# Patient Record
Sex: Male | Born: 1975 | Race: Black or African American | Hispanic: No | Marital: Single | Smoking: Never smoker
Health system: Southern US, Community
[De-identification: ages and names within clinical notes are randomized; demographics above are authoritative.]

## PROBLEM LIST (undated history)

## (undated) DIAGNOSIS — I10 Essential (primary) hypertension: Secondary | ICD-10-CM

## (undated) DIAGNOSIS — K429 Umbilical hernia without obstruction or gangrene: Secondary | ICD-10-CM

## (undated) HISTORY — PX: MOUTH SURGERY: SHX715

---

## 2010-02-09 ENCOUNTER — Encounter: Admission: RE | Admit: 2010-02-09 | Discharge: 2010-02-09 | Payer: Self-pay | Admitting: Family Medicine

## 2017-01-04 DIAGNOSIS — Z23 Encounter for immunization: Secondary | ICD-10-CM | POA: Diagnosis not present

## 2017-01-10 DIAGNOSIS — Z0000 Encounter for general adult medical examination without abnormal findings: Secondary | ICD-10-CM | POA: Diagnosis not present

## 2017-02-19 DIAGNOSIS — R310 Gross hematuria: Secondary | ICD-10-CM | POA: Diagnosis not present

## 2018-05-09 DIAGNOSIS — I499 Cardiac arrhythmia, unspecified: Secondary | ICD-10-CM | POA: Diagnosis not present

## 2018-05-09 DIAGNOSIS — I10 Essential (primary) hypertension: Secondary | ICD-10-CM | POA: Diagnosis not present

## 2019-07-29 DIAGNOSIS — R1033 Periumbilical pain: Secondary | ICD-10-CM | POA: Diagnosis not present

## 2019-07-29 DIAGNOSIS — I10 Essential (primary) hypertension: Secondary | ICD-10-CM | POA: Diagnosis not present

## 2019-07-29 DIAGNOSIS — Z0000 Encounter for general adult medical examination without abnormal findings: Secondary | ICD-10-CM | POA: Diagnosis not present

## 2019-07-29 DIAGNOSIS — E782 Mixed hyperlipidemia: Secondary | ICD-10-CM | POA: Diagnosis not present

## 2019-07-29 DIAGNOSIS — Z1211 Encounter for screening for malignant neoplasm of colon: Secondary | ICD-10-CM | POA: Diagnosis not present

## 2019-07-29 DIAGNOSIS — Z125 Encounter for screening for malignant neoplasm of prostate: Secondary | ICD-10-CM | POA: Diagnosis not present

## 2019-07-30 ENCOUNTER — Other Ambulatory Visit: Payer: Self-pay | Admitting: Family Medicine

## 2019-07-30 DIAGNOSIS — R1033 Periumbilical pain: Secondary | ICD-10-CM

## 2019-08-10 ENCOUNTER — Ambulatory Visit
Admission: RE | Admit: 2019-08-10 | Discharge: 2019-08-10 | Disposition: A | Discharge status: Home / Self Care | Payer: 59 | Source: Ambulatory Visit | Attending: Family Medicine | Admitting: Family Medicine

## 2019-08-10 DIAGNOSIS — R1033 Periumbilical pain: Secondary | ICD-10-CM

## 2019-08-10 DIAGNOSIS — K5730 Diverticulosis of large intestine without perforation or abscess without bleeding: Secondary | ICD-10-CM | POA: Diagnosis not present

## 2019-08-10 DIAGNOSIS — K429 Umbilical hernia without obstruction or gangrene: Secondary | ICD-10-CM | POA: Diagnosis not present

## 2019-08-10 MED ORDER — IOPAMIDOL (ISOVUE-300) INJECTION 61%
100.0000 mL | Freq: Once | INTRAVENOUS | Status: AC | PRN
  Administered 2019-08-10: 11:00:00 100 mL via INTRAVENOUS

## 2019-08-13 ENCOUNTER — Ambulatory Visit: Payer: Self-pay | Admitting: Surgery

## 2019-08-13 DIAGNOSIS — K429 Umbilical hernia without obstruction or gangrene: Secondary | ICD-10-CM | POA: Diagnosis not present

## 2019-08-13 NOTE — H&P (Signed)
History of Present Illness Nathan Henson. Nathan Hollenbaugh MD; 08/13/2019 3:43 PM) The patient is a 44 year old male who presents with an umbilical hernia. Referred by Dr. Hulan Henson for umbilical hernia.  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 Occasionally this tenderness is quite severe. He was seen by Dr. Rex Henson who performed a CT scan. This showed a small umbilical hernia at containing a short segment of small bowel. There is no sign of bowel obstruction. He presents now to discuss surgical repair.  CLINICAL DATA: Periumbilical pain for 1 month.  EXAM: CT ABDOMEN AND PELVIS WITH CONTRAST  TECHNIQUE: Multidetector CT imaging of the abdomen and pelvis was performed using the standard protocol following bolus administration of intravenous contrast.  CONTRAST: 171mL ISOVUE-300 IOPAMIDOL (ISOVUE-300) INJECTION 61%  COMPARISON: None.  FINDINGS: Lower chest: The visualized lung bases are clear.  Hepatobiliary: Subcentimeter hypodensity in the right hepatic lobe, too small to fully characterize. Unremarkable gallbladder. No biliary dilatation.  Pancreas: Unremarkable.  Spleen: Unremarkable.  Adrenals/Urinary Tract: Unremarkable adrenal glands. No evidence of renal mass, calculi, or hydronephrosis. Unremarkable bladder.  Stomach/Bowel: The stomach is unremarkable. There is mild-to-moderate left-sided colonic diverticulosis without evidence of acute diverticulitis. There is a small umbilical hernia containing a short segment of small bowel without evidence of bowel obstruction or inflammation. The appendix is unremarkable.  Vascular/Lymphatic: Normal caliber of the abdominal aorta. No enlarged lymph nodes.  Reproductive: Unremarkable prostate.  Other: No ascites or pneumoperitoneum.  Musculoskeletal: No acute osseous abnormality or suspicious  osseous lesion.  IMPRESSION: 1. Small umbilical hernia containing a short segment of small bowel. No evidence of bowel obstruction or inflammation. 2. Colonic diverticulosis.   Electronically Signed By: Nathan Henson M.D. On: 08/10/2019 15:44   Problem List/Past Medical Nathan Key K. Huck Ashworth, MD; [id number] PM) UMBILICAL HERNIA WITHOUT OBSTRUCTION OR GANGRENE (K42.9)  Past Surgical History (Nathan Henson, Nathan Henson; 08/13/2019 3:00 PM) No pertinent past surgical history  Diagnostic Studies History (Nathan Henson; 08/13/2019 3:00 PM) Colonoscopy never  Allergies (Nathan Henson; 08/13/2019 3:00 PM) No Known Drug Allergies [08/13/2019]: Allergies Reconciled  Medication History (Nathan Henson; 08/13/2019 3:00 PM) No Current Medications Medications Reconciled  Social History (Nathan Henson; 08/13/2019 3:00 PM) Alcohol use Occasional alcohol use. Caffeine use Tea. No drug use Tobacco use Never smoker.  Family History (Nathan Henson, Nathan Henson; 08/13/2019 3:00 PM) Hypertension Brother.  Other Problems Nathan Henson. Nathan Salaam, MD; 08/13/2019 3:44 PM) High blood pressure     Review of Systems (Nathan Henson; 08/13/2019 3:00 PM) General Not Present- Appetite Loss, Chills, Fatigue, Fever, Night Sweats, Weight Gain and Weight Loss. Skin Not Present- Change in Wart/Mole, Dryness, Hives, Jaundice, New Lesions, Non-Healing Wounds, Rash and Ulcer. HEENT Not Present- Earache, Hearing Loss, Hoarseness, Nose Bleed, Oral Ulcers, Ringing in the Ears, Seasonal Allergies, Sinus Pain, Sore Throat, Visual Disturbances, Wears glasses/contact lenses and Yellow Eyes. Respiratory Not Present- Bloody sputum, Chronic Cough, Difficulty Breathing, Snoring and Wheezing. Breast Not Present- Breast Mass, Breast Pain, Nipple Discharge and Skin Changes. Cardiovascular Not Present- Chest Pain, Difficulty Breathing Lying Down, Leg Cramps, Palpitations, Rapid Heart Rate, Shortness of Breath and  Swelling of Extremities. Gastrointestinal Not Present- Abdominal Pain, Bloating, Bloody Stool, Change in Bowel Habits, Chronic diarrhea, Constipation, Difficulty Swallowing, Excessive gas, Gets full quickly at meals, Hemorrhoids, Indigestion, Nausea, Rectal Pain and Vomiting. Male Genitourinary Not Present- Blood in Urine, Change in Urinary Stream, Frequency, Impotence, Nocturia,  Painful Urination, Urgency and Urine Leakage. Musculoskeletal Not Present- Back Pain, Joint Pain, Joint Stiffness, Muscle Pain, Muscle Weakness and Swelling of Extremities. Neurological Not Present- Decreased Memory, Fainting, Headaches, Numbness, Seizures, Tingling, Tremor, Trouble walking and Weakness. Psychiatric Not Present- Anxiety, Bipolar, Change in Sleep Pattern, Depression, Fearful and Frequent crying. Endocrine Not Present- Cold Intolerance, Excessive Hunger, Hair Changes, Heat Intolerance, Hot flashes and New Diabetes. Hematology Not Present- Blood Thinners, Easy Bruising, Excessive bleeding, Gland problems, HIV and Persistent Infections.  Vitals (Nathan Nolan Henson; 08/13/2019 3:00 PM) 08/13/2019 3:00 PM Weight: 172.25 lb Height: 69in Body Surface Area: 1.94 m Body Mass Index: 25.44 kg/m  Temp.: 98.25F  Pulse: 80 (Regular)  BP: 120/76(Sitting, Left Arm, Standard)        Physical Exam Nathan Key K. Aniqa Hare MD; 08/13/2019 3:44 PM)  The physical exam findings are as follows: Note:Constitutional: WDWN in NAD, conversant, no obvious deformities; resting comfortably Eyes: Pupils equal, round; sclera anicteric; moist conjunctiva; no lid lag HENT: Oral mucosa moist; good dentition Neck: No masses palpated, trachea midline; no thyromegaly Lungs: CTA bilaterally; normal respiratory effort CV: Regular rate and rhythm; no murmurs; extremities well-perfused with no edema Abd: +bowel sounds, soft, non-tender, no palpable organomegaly; small palpable umbilical hernia - reducible with 1.5 cm defect. Musc:  Normal gait; no apparent clubbing or cyanosis in extremities Lymphatic: No palpable cervical or axillary lymphadenopathy Skin: Warm, dry; no sign of jaundice Psychiatric - alert and oriented x 4; calm mood and affect    Assessment & Plan Nathan Key K. Aneta Hendershott MD; [id number] PM)  UMBILICAL HERNIA WITHOUT OBSTRUCTION OR GANGRENE (K42.9)  Current Plans Schedule for Surgery - Umbilical hernia repair with mesh. The surgical procedure has been discussed with the patient. Potential risks, benefits, alternative treatments, and expected outcomes have been explained. All of the patient's questions at this time have been answered. The likelihood of reaching the patient's treatment goal is good. The patient understand the proposed surgical procedure and wishes to proceed.  Nathan Henson. Georgette Dover, MD, [facility]  General/ Trauma Surgery   08/13/2019 3:44 PM

## 2020-02-09 ENCOUNTER — Encounter (HOSPITAL_BASED_OUTPATIENT_CLINIC_OR_DEPARTMENT_OTHER): Payer: Self-pay | Admitting: Surgery

## 2020-02-09 ENCOUNTER — Other Ambulatory Visit: Payer: Self-pay

## 2020-02-12 ENCOUNTER — Other Ambulatory Visit (HOSPITAL_COMMUNITY)
Admission: RE | Admit: 2020-02-12 | Discharge: 2020-02-12 | Disposition: A | Discharge status: Home / Self Care | Payer: 59 | Source: Ambulatory Visit | Attending: Surgery | Admitting: Surgery

## 2020-02-12 DIAGNOSIS — Z20822 Contact with and (suspected) exposure to covid-19: Secondary | ICD-10-CM | POA: Insufficient documentation

## 2020-02-12 DIAGNOSIS — Z01812 Encounter for preprocedural laboratory examination: Secondary | ICD-10-CM | POA: Insufficient documentation

## 2020-02-12 LAB — SARS CORONAVIRUS 2 (TAT 6-24 HRS): SARS Coronavirus 2: NEGATIVE

## 2020-02-16 ENCOUNTER — Ambulatory Visit (HOSPITAL_BASED_OUTPATIENT_CLINIC_OR_DEPARTMENT_OTHER): Payer: 59 | Admitting: Certified Registered"

## 2020-02-16 ENCOUNTER — Encounter (HOSPITAL_BASED_OUTPATIENT_CLINIC_OR_DEPARTMENT_OTHER)
Admission: RE | Disposition: A | Discharge status: Home / Self Care | Payer: Self-pay | Source: Home / Self Care | Attending: Surgery

## 2020-02-16 ENCOUNTER — Encounter (HOSPITAL_BASED_OUTPATIENT_CLINIC_OR_DEPARTMENT_OTHER): Payer: Self-pay | Admitting: Surgery

## 2020-02-16 ENCOUNTER — Ambulatory Visit: Payer: Self-pay | Admitting: Surgery

## 2020-02-16 ENCOUNTER — Other Ambulatory Visit: Payer: Self-pay

## 2020-02-16 ENCOUNTER — Ambulatory Visit (HOSPITAL_BASED_OUTPATIENT_CLINIC_OR_DEPARTMENT_OTHER)
Admission: RE | Admit: 2020-02-16 | Discharge: 2020-02-16 | Disposition: A | Discharge status: Home / Self Care | Payer: 59 | Attending: Surgery | Admitting: Surgery

## 2020-02-16 DIAGNOSIS — K429 Umbilical hernia without obstruction or gangrene: Secondary | ICD-10-CM | POA: Diagnosis not present

## 2020-02-16 DIAGNOSIS — I10 Essential (primary) hypertension: Secondary | ICD-10-CM | POA: Diagnosis not present

## 2020-02-16 DIAGNOSIS — K5730 Diverticulosis of large intestine without perforation or abscess without bleeding: Secondary | ICD-10-CM | POA: Diagnosis not present

## 2020-02-16 HISTORY — PX: UMBILICAL HERNIA REPAIR: SHX196

## 2020-02-16 HISTORY — PX: INSERTION OF MESH: SHX5868

## 2020-02-16 HISTORY — DX: Umbilical hernia without obstruction or gangrene: K42.9

## 2020-02-16 HISTORY — DX: Essential (primary) hypertension: I10

## 2020-02-16 SURGERY — REPAIR, HERNIA, UMBILICAL, ADULT
Anesthesia: General | Site: Abdomen

## 2020-02-16 MED ORDER — ACETAMINOPHEN 500 MG PO TABS
1000.0000 mg | ORAL_TABLET | ORAL | Status: AC
  Administered 2020-02-16: 1000 mg via ORAL

## 2020-02-16 MED ORDER — FENTANYL CITRATE (PF) 100 MCG/2ML IJ SOLN: 25.0000 ug | INTRAMUSCULAR | Status: DC | PRN

## 2020-02-16 MED ORDER — LACTATED RINGERS IV SOLN: INTRAVENOUS | Status: DC

## 2020-02-16 MED ORDER — LIDOCAINE HCL (CARDIAC) PF 100 MG/5ML IV SOSY
PREFILLED_SYRINGE | INTRAVENOUS | Status: DC | PRN
  Administered 2020-02-16: 100 mg via INTRAVENOUS

## 2020-02-16 MED ORDER — ONDANSETRON HCL 4 MG/2ML IJ SOLN
INTRAMUSCULAR | Status: DC | PRN
  Administered 2020-02-16: 4 mg via INTRAVENOUS

## 2020-02-16 MED ORDER — FENTANYL CITRATE (PF) 100 MCG/2ML IJ SOLN
INTRAMUSCULAR | Status: AC
  Filled 2020-02-16: qty 2

## 2020-02-16 MED ORDER — CEFAZOLIN SODIUM-DEXTROSE 2-4 GM/100ML-% IV SOLN
INTRAVENOUS | Status: AC
  Filled 2020-02-16: qty 100

## 2020-02-16 MED ORDER — PHENYLEPHRINE HCL (PRESSORS) 10 MG/ML IV SOLN
INTRAVENOUS | Status: DC | PRN
  Administered 2020-02-16 (×3): 40 ug via INTRAVENOUS

## 2020-02-16 MED ORDER — ACETAMINOPHEN 500 MG PO TABS
ORAL_TABLET | ORAL | Status: AC
  Filled 2020-02-16: qty 2

## 2020-02-16 MED ORDER — GABAPENTIN 300 MG PO CAPS
300.0000 mg | ORAL_CAPSULE | ORAL | Status: AC
  Administered 2020-02-16: 300 mg via ORAL

## 2020-02-16 MED ORDER — PROPOFOL 10 MG/ML IV BOLUS
INTRAVENOUS | Status: AC
  Filled 2020-02-16: qty 20

## 2020-02-16 MED ORDER — CHLORHEXIDINE GLUCONATE CLOTH 2 % EX PADS: 6.0000 | MEDICATED_PAD | Freq: Once | CUTANEOUS | Status: DC

## 2020-02-16 MED ORDER — ONDANSETRON HCL 4 MG/2ML IJ SOLN: 4.0000 mg | Freq: Once | INTRAMUSCULAR | Status: DC | PRN

## 2020-02-16 MED ORDER — LIDOCAINE 2% (20 MG/ML) 5 ML SYRINGE
INTRAMUSCULAR | Status: AC
  Filled 2020-02-16: qty 5

## 2020-02-16 MED ORDER — CEFAZOLIN SODIUM-DEXTROSE 2-4 GM/100ML-% IV SOLN
2.0000 g | INTRAVENOUS | Status: AC
  Administered 2020-02-16: 2 g via INTRAVENOUS

## 2020-02-16 MED ORDER — EPHEDRINE 5 MG/ML INJ
INTRAVENOUS | Status: AC
  Filled 2020-02-16: qty 10

## 2020-02-16 MED ORDER — ONDANSETRON HCL 4 MG/2ML IJ SOLN
INTRAMUSCULAR | Status: AC
  Filled 2020-02-16: qty 2

## 2020-02-16 MED ORDER — MIDAZOLAM HCL 5 MG/5ML IJ SOLN
INTRAMUSCULAR | Status: DC | PRN
  Administered 2020-02-16: 2 mg via INTRAVENOUS

## 2020-02-16 MED ORDER — KETOROLAC TROMETHAMINE 30 MG/ML IJ SOLN: 30.0000 mg | Freq: Once | INTRAMUSCULAR | Status: DC | PRN

## 2020-02-16 MED ORDER — PHENYLEPHRINE 40 MCG/ML (10ML) SYRINGE FOR IV PUSH (FOR BLOOD PRESSURE SUPPORT)
PREFILLED_SYRINGE | INTRAVENOUS | Status: AC
  Filled 2020-02-16: qty 10

## 2020-02-16 MED ORDER — GABAPENTIN 300 MG PO CAPS
ORAL_CAPSULE | ORAL | Status: AC
  Filled 2020-02-16: qty 1

## 2020-02-16 MED ORDER — MEPERIDINE HCL 25 MG/ML IJ SOLN: 6.2500 mg | INTRAMUSCULAR | Status: DC | PRN

## 2020-02-16 MED ORDER — BUPIVACAINE HCL 0.25 % IJ SOLN
INTRAMUSCULAR | Status: DC | PRN
  Administered 2020-02-16: 10 mL

## 2020-02-16 MED ORDER — ACETAMINOPHEN 325 MG PO TABS: 325.0000 mg | ORAL_TABLET | ORAL | Status: DC | PRN

## 2020-02-16 MED ORDER — DEXAMETHASONE SODIUM PHOSPHATE 10 MG/ML IJ SOLN
INTRAMUSCULAR | Status: AC
  Filled 2020-02-16: qty 1

## 2020-02-16 MED ORDER — ACETAMINOPHEN 160 MG/5ML PO SOLN: 325.0000 mg | ORAL | Status: DC | PRN

## 2020-02-16 MED ORDER — OXYCODONE HCL 5 MG PO TABS: 5.0000 mg | ORAL_TABLET | Freq: Once | ORAL | Status: DC | PRN

## 2020-02-16 MED ORDER — DEXAMETHASONE SODIUM PHOSPHATE 10 MG/ML IJ SOLN
INTRAMUSCULAR | Status: DC | PRN
  Administered 2020-02-16: 5 mg via INTRAVENOUS

## 2020-02-16 MED ORDER — MIDAZOLAM HCL 2 MG/2ML IJ SOLN
INTRAMUSCULAR | Status: AC
  Filled 2020-02-16: qty 2

## 2020-02-16 MED ORDER — EPHEDRINE SULFATE 50 MG/ML IJ SOLN
INTRAMUSCULAR | Status: DC | PRN
  Administered 2020-02-16 (×2): 10 mg via INTRAVENOUS

## 2020-02-16 MED ORDER — OXYCODONE HCL 5 MG/5ML PO SOLN: 5.0000 mg | Freq: Once | ORAL | Status: DC | PRN

## 2020-02-16 MED ORDER — PROPOFOL 10 MG/ML IV BOLUS
INTRAVENOUS | Status: DC | PRN
  Administered 2020-02-16: 150 mg via INTRAVENOUS
  Administered 2020-02-16: 50 mg via INTRAVENOUS

## 2020-02-16 MED ORDER — FENTANYL CITRATE (PF) 100 MCG/2ML IJ SOLN
INTRAMUSCULAR | Status: DC | PRN
  Administered 2020-02-16 (×3): 25 ug via INTRAVENOUS

## 2020-02-16 MED ORDER — OXYCODONE HCL 5 MG PO TABS: 5.0000 mg | ORAL_TABLET | Freq: Four times a day (QID) | ORAL | 0 refills | Status: DC | PRN

## 2020-02-16 SURGICAL SUPPLY — 37 items
APL PRP STRL LF DISP 70% ISPRP (MISCELLANEOUS) ×1
APL SKNCLS STERI-STRIP NONHPOA (GAUZE/BANDAGES/DRESSINGS) ×1
BENZOIN TINCTURE PRP APPL 2/3 (GAUZE/BANDAGES/DRESSINGS) ×2 IMPLANT
BLADE CLIPPER SURG (BLADE) ×2 IMPLANT
BLADE HEX COATED 2.75 (ELECTRODE) ×2 IMPLANT
BLADE SURG 15 STRL LF DISP TIS (BLADE) ×1 IMPLANT
BLADE SURG 15 STRL SS (BLADE) ×2
CHLORAPREP W/TINT 26 (MISCELLANEOUS) ×2 IMPLANT
COVER BACK TABLE 60X90IN (DRAPES) ×2 IMPLANT
COVER MAYO STAND STRL (DRAPES) ×2 IMPLANT
DRAPE LAPAROTOMY 100X72 PEDS (DRAPES) ×2 IMPLANT
DRAPE UTILITY XL STRL (DRAPES) ×2 IMPLANT
DRSG TEGADERM 4X4.75 (GAUZE/BANDAGES/DRESSINGS) ×2 IMPLANT
ELECT REM PT RETURN 9FT ADLT (ELECTROSURGICAL) ×2
ELECTRODE REM PT RTRN 9FT ADLT (ELECTROSURGICAL) ×1 IMPLANT
GAUZE SPONGE 4X4 12PLY STRL LF (GAUZE/BANDAGES/DRESSINGS) ×2 IMPLANT
GLOVE BIO SURGEON STRL SZ7 (GLOVE) ×2 IMPLANT
GLOVE BIOGEL PI IND STRL 7.5 (GLOVE) ×1 IMPLANT
GLOVE BIOGEL PI INDICATOR 7.5 (GLOVE) ×1
GOWN STRL REUS W/ TWL LRG LVL3 (GOWN DISPOSABLE) ×2 IMPLANT
GOWN STRL REUS W/TWL LRG LVL3 (GOWN DISPOSABLE) ×4
MESH VENTRALEX ST 1-7/10 CRC S (Mesh General) ×2 IMPLANT
NEEDLE HYPO 25X1 1.5 SAFETY (NEEDLE) ×2 IMPLANT
PACK BASIN DAY SURGERY FS (CUSTOM PROCEDURE TRAY) ×2 IMPLANT
PENCIL SMOKE EVACUATOR (MISCELLANEOUS) ×2 IMPLANT
SLEEVE SCD COMPRESS KNEE MED (MISCELLANEOUS) ×2 IMPLANT
SPONGE GAUZE 2X2 8PLY STRL LF (GAUZE/BANDAGES/DRESSINGS) ×2 IMPLANT
STRIP CLOSURE SKIN 1/2X4 (GAUZE/BANDAGES/DRESSINGS) ×2 IMPLANT
SUT MNCRL AB 4-0 PS2 18 (SUTURE) ×2 IMPLANT
SUT NOVA 0 T19/GS 22DT (SUTURE) ×2 IMPLANT
SUT NOVA NAB DX-16 0-1 5-0 T12 (SUTURE) ×2 IMPLANT
SUT VIC AB 3-0 SH 27 (SUTURE) ×2
SUT VIC AB 3-0 SH 27X BRD (SUTURE) ×1 IMPLANT
SYR CONTROL 10ML LL (SYRINGE) ×2 IMPLANT
TOWEL GREEN STERILE FF (TOWEL DISPOSABLE) ×2 IMPLANT
TUBE CONNECTING 20X1/4 (TUBING) ×2 IMPLANT
YANKAUER SUCT BULB TIP NO VENT (SUCTIONS) ×2 IMPLANT

## 2020-02-16 NOTE — Op Note (Signed)
Indications:  The patient presented with a history of a slowly enlarging umbilical hernia intermittently containing some small bowel.  The patient was examined and we recommended umbilical hernia repair with mesh.  Pre-operative diagnosis:  Umbilical hernia  Post-operative diagnosis:  Same  Procedure:  Umbilical hernia repair with mesh  Surgeon:  [name] Resident:  Dr. Sheria Lang I was personally present during the key and critical portions of this procedure and immediately available throughout the entire procedure, as documented in my operative note.   Procedure Details  The patient was seen again in the Holding Room. The risks, benefits, complications, treatment options, and expected outcomes were discussed with the patient. The possibilities of reaction to medication, pulmonary aspiration, perforation of viscus, bleeding, recurrent infection, the need for additional procedures, and development of a complication requiring transfusion or further operation were discussed with the patient and/or family. There was concurrence with the proposed plan, and informed consent was obtained. The site of surgery was properly noted/marked. The patient was taken to the Operating Room, identified as ARBEN PACKMAN, and the procedure verified as umbilical hernia repair. A Time Out was held and the above information confirmed.  After an adequate level of general anesthesia was obtained, the patient's abdomen was prepped with Chloraprep and draped in sterile fashion.  We made a transverse incision above the umbilicus.  Dissection was carried down to the hernia sac with cautery.  We dissected bluntly around the hernia sac down to the edge of the fascial defect.  The hernia sac was carefully opened and we reduced the small bowel. The fascial defect measured 1.8 cm.  We cleared the fascia in all directions.  A small Ventralex mesh was inserted into the defect and was deployed.  The mesh was secured with  four trans-fascial sutures of 0 Novofil.  The fascial defect was closed with multiple interrupted figure-of-eight 1 Novofil sutures.  The base of the umbilicus was tacked down with 3-0 Vicryl.  3-0 Vicryl was used to close the subcutaneous tissues and 4-0 Monocryl was used to close the skin.  Steri-strips and clean dressing were applied.  The patient was extubated and brought to the recovery room in stable condition.  All sponge, instrument, and needle counts were correct prior to closure and at the conclusion of the case.   Estimated Blood Loss: Minimal          Complications: None; patient tolerated the procedure well.         Disposition: PACU - hemodynamically stable.         Condition: stable  Imogene Burn. Georgette Dover, MD, [facility]  General/ Trauma Surgery   02/16/2020 10:15 AM

## 2020-02-16 NOTE — Anesthesia Procedure Notes (Signed)
Procedure Name: LMA Insertion Date/Time: 02/16/2020 9:42 AM Performed by: Lavonia Dana, CRNA Pre-anesthesia Checklist: Patient identified, Emergency Drugs available, Suction available and Patient being monitored Patient Re-evaluated:Patient Re-evaluated prior to induction Oxygen Delivery Method: Circle system utilized Preoxygenation: Pre-oxygenation with 100% oxygen Induction Type: IV induction Ventilation: Mask ventilation without difficulty LMA: LMA inserted LMA Size: 5.0 Number of attempts: 1 Airway Equipment and Method: Bite block Placement Confirmation: positive ETCO2 Tube secured with: Tape Dental Injury: Teeth and Oropharynx as per pre-operative assessment

## 2020-02-16 NOTE — Interval H&P Note (Signed)
History and Physical Interval Note:  02/16/2020 8:14 AM  Nathan Henson  has presented today for surgery, with the diagnosis of UMBILICAL HERNIA.  The various methods of treatment have been discussed with the patient and family. After consideration of risks, benefits and other options for treatment, the patient has consented to  Procedure(s) with comments: [place] (N/A) - LMA as a surgical intervention.  The patient's history has been reviewed, patient examined, no change in status, stable for surgery.  I have reviewed the patient's chart and labs.  Questions were answered to the patient's satisfaction.     [name]

## 2020-02-16 NOTE — H&P (Signed)
History of Present Illness  The patient is a 44 year old male who presents with an umbilical hernia. Referred by Dr. Hulan Fess for umbilical hernia.  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 Occasionally this tenderness is quite severe. He was seen by Dr. Rex Kras who performed a CT scan. This showed a small umbilical hernia at containing a short segment of small bowel. There is no sign of bowel obstruction. He presents now to discuss surgical repair.  CLINICAL DATA: Periumbilical pain for 1 month.  EXAM: CT ABDOMEN AND PELVIS WITH CONTRAST  TECHNIQUE: Multidetector CT imaging of the abdomen and pelvis was performed using the standard protocol following bolus administration of intravenous contrast.  CONTRAST: 170mL ISOVUE-300 IOPAMIDOL (ISOVUE-300) INJECTION 61%  COMPARISON: None.  FINDINGS: Lower chest: The visualized lung bases are clear.  Hepatobiliary: Subcentimeter hypodensity in the right hepatic lobe, too small to fully characterize. Unremarkable gallbladder. No biliary dilatation.  Pancreas: Unremarkable.  Spleen: Unremarkable.  Adrenals/Urinary Tract: Unremarkable adrenal glands. No evidence of renal mass, calculi, or hydronephrosis. Unremarkable bladder.  Stomach/Bowel: The stomach is unremarkable. There is mild-to-moderate left-sided colonic diverticulosis without evidence of acute diverticulitis. There is a small umbilical hernia containing a short segment of small bowel without evidence of bowel obstruction or inflammation. The appendix is unremarkable.  Vascular/Lymphatic: Normal caliber of the abdominal aorta. No enlarged lymph nodes.  Reproductive: Unremarkable prostate.  Other: No ascites or pneumoperitoneum.  Musculoskeletal: No acute osseous abnormality or suspicious osseous lesion.  IMPRESSION: 1. Small umbilical  hernia containing a short segment of small bowel. No evidence of bowel obstruction or inflammation. 2. Colonic diverticulosis.   Electronically Signed By: Logan Bores M.D. On: 08/10/2019 15:44   Problem List/Past Medical  UMBILICAL HERNIA WITHOUT OBSTRUCTION OR GANGRENE (K42.9)  Past Surgical History  No pertinent past surgical history  Diagnostic Studies History  Colonoscopy never  Allergies  No Known Drug Allergies Allergies Reconciled  Medication History  No Current Medications Medications Reconciled  Social History Alcohol use Occasional alcohol use. Caffeine use Tea. No drug use Tobacco use Never smoker.  Family History  Hypertension Brother.  Other Problems High blood pressure     Review of Systems General Not Present- Appetite Loss, Chills, Fatigue, Fever, Night Sweats, Weight Gain and Weight Loss. Skin Not Present- Change in Wart/Mole, Dryness, Hives, Jaundice, New Lesions, Non-Healing Wounds, Rash and Ulcer. HEENT Not Present- Earache, Hearing Loss, Hoarseness, Nose Bleed, Oral Ulcers, Ringing in the Ears, Seasonal Allergies, Sinus Pain, Sore Throat, Visual Disturbances, Wears glasses/contact lenses and Yellow Eyes. Respiratory Not Present- Bloody sputum, Chronic Cough, Difficulty Breathing, Snoring and Wheezing. Breast Not Present- Breast Mass, Breast Pain, Nipple Discharge and Skin Changes. Cardiovascular Not Present- Chest Pain, Difficulty Breathing Lying Down, Leg Cramps, Palpitations, Rapid Heart Rate, Shortness of Breath and Swelling of Extremities. Gastrointestinal Not Present- Abdominal Pain, Bloating, Bloody Stool, Change in Bowel Habits, Chronic diarrhea, Constipation, Difficulty Swallowing, Excessive gas, Gets full quickly at meals, Hemorrhoids, Indigestion, Nausea, Rectal Pain and Vomiting. Male Genitourinary Not Present- Blood in Urine, Change in Urinary Stream, Frequency, Impotence, Nocturia, Painful Urination,  Urgency and Urine Leakage. Musculoskeletal Not Present- Back Pain, Joint Pain, Joint Stiffness, Muscle Pain, Muscle Weakness and Swelling of Extremities. Neurological Not Present- Decreased Memory, Fainting, Headaches, Numbness, Seizures, Tingling, Tremor, Trouble walking and Weakness. Psychiatric Not Present- Anxiety, Bipolar, Change in Sleep Pattern, Depression, Fearful and Frequent crying. Endocrine Not Present- Cold Intolerance,  Excessive Hunger, Hair Changes, Heat Intolerance, Hot flashes and New Diabetes. Hematology Not Present- Blood Thinners, Easy Bruising, Excessive bleeding, Gland problems, HIV and Persistent Infections.  Vitals  Weight: 172.25 lb Height: 69in Body Surface Area: 1.94 m Body Mass Index: 25.44 kg/m  Temp.: 98.30F  Pulse: 80 (Regular)  BP: 120/76(Sitting, Left Arm, Standard)        Physical Exam   The physical exam findings are as follows: Note:Constitutional: WDWN in NAD, conversant, no obvious deformities; resting comfortably Eyes: Pupils equal, round; sclera anicteric; moist conjunctiva; no lid lag HENT: Oral mucosa moist; good dentition Neck: No masses palpated, trachea midline; no thyromegaly Lungs: CTA bilaterally; normal respiratory effort CV: Regular rate and rhythm; no murmurs; extremities well-perfused with no edema Abd: +bowel sounds, soft, non-tender, no palpable organomegaly; small palpable umbilical hernia - reducible with 1.5 cm defect. Musc: Normal gait; no apparent clubbing or cyanosis in extremities Lymphatic: No palpable cervical or axillary lymphadenopathy Skin: Warm, dry; no sign of jaundice Psychiatric - alert and oriented x 4; calm mood and affect    Assessment & Plan   UMBILICAL HERNIA WITHOUT OBSTRUCTION OR GANGRENE (K42.9)  Current Plans Schedule for Surgery - Umbilical hernia repair with mesh. The surgical procedure has been discussed with the patient. Potential risks, benefits, alternative  treatments, and expected outcomes have been explained. All of the patient's questions at this time have been answered. The likelihood of reaching the patient's treatment goal is good. The patient understand the proposed surgical procedure and wishes to proceed.  Imogene Burn. Georgette Dover, MD, [facility]  General/ Trauma Surgery   02/16/2020 7:27 AM

## 2020-02-16 NOTE — Discharge Instructions (Signed)
[facility]  UMBILICAL OR INGUINAL HERNIA REPAIR: POST OP INSTRUCTIONS  Always review your discharge instruction sheet given to you by the facility where your surgery was performed. IF YOU HAVE DISABILITY OR FAMILY LEAVE FORMS, YOU MUST BRING THEM TO THE OFFICE FOR PROCESSING.   DO NOT GIVE THEM TO YOUR DOCTOR.  1. A  prescription for pain medication may be given to you upon discharge.  Take your pain medication as prescribed, if needed.  If narcotic pain medicine is not needed, then you may take acetaminophen (Tylenol) or ibuprofen (Advil) as needed. 2. Take your usually prescribed medications unless otherwise directed. If you need a refill on your pain medication, please contact your pharmacy.  They will contact our office to request authorization. Prescriptions will not be filled after 5 pm or on week-ends. 3. You should follow a light diet the first 24 hours after arrival home, such as soup and crackers, etc.  Be sure to include lots of fluids daily.  Resume your normal diet the day after surgery. 4.Most patients will experience some swelling and bruising around the umbilicus or in the groin and scrotum.  Ice packs and reclining will help.  Swelling and bruising can take several days to resolve.  6. It is common to experience some constipation if taking pain medication after surgery.  Increasing fluid intake and taking a stool softener (such as Colace) will usually help or prevent this problem from occurring.  A mild laxative (Milk of Magnesia or Miralax) should be taken according to package directions if there are no bowel movements after 48 hours. 7. Unless discharge instructions indicate otherwise, you may remove your bandages 24-48 hours after surgery, and you may shower at that time.  You may have steri-strips (small skin tapes) in place directly over the incision.  These strips should be left on the skin for 7-10 days.  If your surgeon used skin glue on the  incision, you may shower in 24 hours.  The glue will flake off over the next 2-3 weeks.  Any sutures or staples will be removed at the office during your follow-up visit. 8. ACTIVITIES:  You may resume regular (light) daily activities beginning the next day--such as daily self-care, walking, climbing stairs--gradually increasing activities as tolerated.  You may have sexual intercourse when it is comfortable.  Refrain from any heavy lifting or straining until approved by your doctor.  a.You may drive when you are no longer taking prescription pain medication, you can comfortably wear a seatbelt, and you can safely maneuver your car and apply brakes. b.RETURN TO WORK:   _____________________________________________  9.You should see your doctor in the office for a follow-up appointment approximately 2-3 weeks after your surgery.  Make sure that you call for this appointment within a day or two after you arrive home to insure a convenient appointment time. 10.OTHER INSTRUCTIONS: _________________________    _____________________________________  WHEN TO CALL YOUR DOCTOR: 1. Fever over 101.0 2. Inability to urinate 3. Nausea and/or vomiting 4. Extreme swelling or bruising 5. Continued bleeding from incision. 6. Increased pain, redness, or drainage from the incision  The clinic staff is available to answer your questions during regular business hours.  Please don't hesitate to call and ask to speak to one of the nurses for clinical concerns.  If you have a medical emergency, go to the nearest emergency room or call 911.  A surgeon from [facility] is always on call at the hospital     [address] [place], [place], [place]  [address] ?  [address], [place], [place]   [address] [phone] ? [phone] ? FAX [phone] Web site: [email or web address]   Post Anesthesia Home Care Instructions  Activity: Get plenty of rest for the remainder of the day. A  responsible individual must stay with you for 24 hours following the procedure.  For the next 24 hours, DO NOT: -Drive a car -Paediatric nurse -Drink alcoholic beverages -Take any medication unless instructed by your physician -Make any legal decisions or sign important papers.  Meals: Start with liquid foods such as gelatin or soup. Progress to regular foods as tolerated. Avoid greasy, spicy, heavy foods. If nausea and/or vomiting occur, drink only clear liquids until the nausea and/or vomiting subsides. Call your physician if vomiting continues.  Special Instructions/Symptoms: Your throat may feel dry or sore from the anesthesia or the breathing tube placed in your throat during surgery. If this causes discomfort, gargle with warm salt water. The discomfort should disappear within 24 hours.  If you had a scopolamine patch placed behind your ear for the management of post- operative nausea and/or vomiting:  1. The medication in the patch is effective for 72 hours, after which it should be removed.  Wrap patch in a tissue and discard in the trash. Wash hands thoroughly with soap and water. 2. You may remove the patch earlier than 72 hours if you experience unpleasant side effects which may include dry mouth, dizziness or visual disturbances. 3. Avoid touching the patch. Wash your hands with soap and water after contact with the patch.    No tylenol today until after 2:30 if needed.

## 2020-02-16 NOTE — Anesthesia Preprocedure Evaluation (Signed)
Anesthesia Evaluation  Patient identified by MRN, date of birth, ID band Patient awake    Reviewed: Allergy & Precautions, Patient's Chart, lab work & pertinent test results  Airway Mallampati: I       Dental no notable dental hx.    Pulmonary neg pulmonary ROS,    Pulmonary exam normal        Cardiovascular hypertension, Normal cardiovascular exam     Neuro/Psych negative neurological ROS  negative psych ROS   GI/Hepatic negative GI ROS, Neg liver ROS,   Endo/Other  negative endocrine ROS  Renal/GU negative Renal ROS  negative genitourinary   Musculoskeletal negative musculoskeletal ROS (+)   Abdominal Normal abdominal exam  (+)   Peds  Hematology negative hematology ROS (+)   Anesthesia Other Findings   Reproductive/Obstetrics                             Anesthesia Physical Anesthesia Plan  ASA: II  Anesthesia Plan: General   Post-op Pain Management:    Induction: Intravenous  PONV Risk Score and Plan: 4 or greater and Ondansetron, Dexamethasone and Midazolam  Airway Management Planned: Oral ETT  Additional Equipment: None  Intra-op Plan:   Post-operative Plan: Extubation in OR  Informed Consent: I have reviewed the patients History and Physical, chart, labs and discussed the procedure including the risks, benefits and alternatives for the proposed anesthesia with the patient or authorized representative who has indicated his/her understanding and acceptance.     Dental advisory given  Plan Discussed with: CRNA  Anesthesia Plan Comments:         Anesthesia Quick Evaluation

## 2020-02-16 NOTE — Transfer of Care (Signed)
Immediate Anesthesia Transfer of Care Note  Patient: Nathan Henson  Procedure(s) Performed: HERNIA REPAIR UMBILICAL ADULT WITH MESH (N/A Abdomen) INSERTION OF MESH (N/A Abdomen)  Patient Location: PACU  Anesthesia Type:General  Level of Consciousness: drowsy  Airway & Oxygen Therapy: Patient Spontanous Breathing and Patient connected to face mask oxygen  Post-op Assessment: Report given to RN and Post -op Vital signs reviewed and stable  Post vital signs: Reviewed and stable  Last Vitals:  Vitals Value Taken Time  BP 120/93 02/16/20 1024  Temp    Pulse 85 02/16/20 1027  Resp 11 02/16/20 1027  SpO2 100 % 02/16/20 1027  Vitals shown include unvalidated device data.  Last Pain:  Vitals:   02/16/20 0821  TempSrc: Oral  PainSc: 0-No pain         Complications: No complications documented.

## 2020-02-16 NOTE — Anesthesia Postprocedure Evaluation (Signed)
Anesthesia Post Note  Patient: Nathan Henson  Procedure(s) Performed: HERNIA REPAIR UMBILICAL ADULT WITH MESH (N/A Abdomen) INSERTION OF MESH (N/A Abdomen)     Patient location during evaluation: PACU Anesthesia Type: General Level of consciousness: awake Pain management: pain level controlled Vital Signs Assessment: post-procedure vital signs reviewed and stable Respiratory status: spontaneous breathing Cardiovascular status: stable Postop Assessment: no apparent nausea or vomiting Anesthetic complications: no   No complications documented.  Last Vitals:  Vitals:   02/16/20 0821 02/16/20 1026  BP: (!) 169/97 (!) 120/93  Pulse: 72 91  Resp: 16 14  Temp: 36.7 C   SpO2: 100% 100%    Last Pain:  Vitals:   02/16/20 0821  TempSrc: Oral  PainSc: 0-No pain                 Huston Foley

## 2020-02-17 ENCOUNTER — Encounter (HOSPITAL_BASED_OUTPATIENT_CLINIC_OR_DEPARTMENT_OTHER): Payer: Self-pay | Admitting: Surgery

## 2021-02-02 ENCOUNTER — Encounter (INDEPENDENT_AMBULATORY_CARE_PROVIDER_SITE_OTHER): Payer: Self-pay

## 2021-02-02 DIAGNOSIS — H5213 Myopia, bilateral: Secondary | ICD-10-CM | POA: Diagnosis not present

## 2021-02-02 DIAGNOSIS — H524 Presbyopia: Secondary | ICD-10-CM | POA: Diagnosis not present

## 2021-02-02 DIAGNOSIS — H52222 Regular astigmatism, left eye: Secondary | ICD-10-CM | POA: Diagnosis not present

## 2021-02-02 DIAGNOSIS — Z1211 Encounter for screening for malignant neoplasm of colon: Secondary | ICD-10-CM | POA: Diagnosis not present

## 2021-02-02 DIAGNOSIS — Z0000 Encounter for general adult medical examination without abnormal findings: Secondary | ICD-10-CM | POA: Diagnosis not present

## 2021-02-02 DIAGNOSIS — H3562 Retinal hemorrhage, left eye: Secondary | ICD-10-CM | POA: Diagnosis not present

## 2021-02-02 DIAGNOSIS — E782 Mixed hyperlipidemia: Secondary | ICD-10-CM | POA: Diagnosis not present

## 2021-02-02 DIAGNOSIS — I10 Essential (primary) hypertension: Secondary | ICD-10-CM | POA: Diagnosis not present

## 2021-02-08 ENCOUNTER — Other Ambulatory Visit: Payer: Self-pay

## 2021-02-08 ENCOUNTER — Ambulatory Visit (INDEPENDENT_AMBULATORY_CARE_PROVIDER_SITE_OTHER): Payer: 59 | Admitting: Ophthalmology

## 2021-02-08 ENCOUNTER — Encounter (INDEPENDENT_AMBULATORY_CARE_PROVIDER_SITE_OTHER): Payer: Self-pay | Admitting: Ophthalmology

## 2021-02-08 DIAGNOSIS — H35049 Retinal micro-aneurysms, unspecified, unspecified eye: Secondary | ICD-10-CM | POA: Insufficient documentation

## 2021-02-08 NOTE — Progress Notes (Signed)
02/08/2021     CHIEF COMPLAINT Patient presents for  Chief Complaint  Patient presents with   Retina Evaluation      HISTORY OF PRESENT ILLNESS: Nathan Henson is a 45 y.o. male who presents to the clinic today for:   HPI     Retina Evaluation   In left eye.  This started 1 week ago.  Associated Symptoms Negative for Flashes, Floaters, Distortion, Pain and Photophobia.        Comments   NP vitreous hem OS, oct, fp (unable). Referred by Dr. Barbie Haggis at [facility] on 02/02/21. Pt states "I went for my annual eye exam and Dr Nathan Henson said I have blood in my eye and he wanted me to come here for a second opinion."      Last edited by Nathan Henson on 02/08/2021  8:40 AM.      Referring physician: Keene Henson., MD [address],  [place] [address]  HISTORICAL INFORMATION:   Selected notes from the [place]: No current outpatient medications on file. (Ophthalmic Drugs)   No current facility-administered medications for this visit. (Ophthalmic Drugs)   Current Outpatient Medications (Other)  Medication Sig   oxyCODONE (OXY IR/ROXICODONE) 5 MG immediate release tablet Take 1 tablet (5 mg total) by mouth every 6 (six) hours as needed for severe pain.   No current facility-administered medications for this visit. (Other)      REVIEW OF SYSTEMS:    ALLERGIES No Known Allergies  PAST MEDICAL HISTORY Past Medical History:  Diagnosis Date   Hypertension    no meds   Umbilical hernia    Past Surgical History:  Procedure Laterality Date   INSERTION OF MESH N/A 02/16/2020   Procedure: INSERTION OF MESH;  Surgeon: Nathan Mesa, MD;  Location: [place];  Service: General;  Laterality: N/A;   MOUTH SURGERY     UMBILICAL HERNIA REPAIR N/A 02/16/2020   Procedure: HERNIA REPAIR UMBILICAL ADULT WITH MESH;  Surgeon: Nathan Mesa, MD;  Location: [place];  Service: General;  Laterality: N/A;    FAMILY HISTORY History reviewed. No pertinent family history.  SOCIAL HISTORY Social History   Tobacco Use   Smoking status: Never   Smokeless tobacco: Never  Substance Use Topics   Alcohol use: Never   Drug use: Never         OPHTHALMIC EXAM:  Base Eye Exam     Visual Acuity (ETDRS)       Right Left   Dist [place] 20/40 -2 20/40 -2   Dist ph [place] 20/25 -1 20/25 -1         Tonometry (Tonopen, 8:44 AM)       Right Left   Pressure 13 15         Pupils       Pupils Dark Light Shape React APD   Right PERRL 5 4 Round Brisk None   Left PERRL 5 4 Round Brisk None         Visual Fields (Counting fingers)       Left Right    Full Full         Extraocular Movement       Right Left    Full Full         Neuro/Psych     Oriented x3: Yes   Mood/Affect: Normal  Dilation     Both eyes: 1.0% Mydriacyl, 2.5% Phenylephrine @ 8:44 AM           Slit Lamp and Fundus Exam     External Exam       Right Left   External Normal Normal         Slit Lamp Exam       Right Left   Lids/Lashes Normal Normal   Conjunctiva/Sclera White and quiet White and quiet   Cornea Clear Clear   Anterior Chamber Deep and quiet Deep and quiet   Iris Round and reactive Round and reactive   Lens Clear Clear   Anterior Vitreous Normal Normal         Fundus Exam       Right Left   Posterior Vitreous Normal Normal   Disc Normal Normal   C/D Ratio 0.35 0.35   Macula Normal Normal   Vessels Normal, no miliary aneurysms 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 Scan locations included subfoveal. Central Foveal Thickness: 272. Progression has no prior data. Findings include normal foveal contour.   Left Eye Quality was good. Scan locations included subfoveal. Central Foveal Thickness: 276. Progression has no prior data. Findings include normal foveal contour.              ASSESSMENT/PLAN:  Miliary aneurysms of retina I explained to the patient that these tend to be monocular and they do not tend to develop in the fellow eye  The peripheral location of these are no current threat to the posterior pole nor to vision.  Nonetheless typically these areas are treated with moderate to heavy and laser to ablate these areas so they do not continue to progress in a rapid fashion.  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  I reassured the patient that this condition is monocular and does not develop in the fellow eye  3.  Ophthalmic Meds Ordered this visit:  No orders of the defined types were placed in this encounter.      Return in about 4 weeks (around 03/08/2021) for DILATE OU, COLOR FP, OPTOS FFA L/R, FOCAL with PRP lens, OS.  There are no Patient Instructions on file for this visit.   Explained the diagnoses, plan, and follow up with the patient and they expressed understanding.  Patient expressed understanding of the importance of proper follow up care.   Nathan Henson M.D. Diseases & Surgery of the Retina and Vitreous Retina &  Diabetic [place] 02/08/21  Abbreviations: M myopia (nearsighted); A astigmatism; H hyperopia (farsighted); P presbyopia; Mrx spectacle prescription;  CTL contact lenses; OD right eye; OS left eye; OU both eyes  XT exotropia; ET esotropia; PEK punctate epithelial keratitis; PEE punctate epithelial erosions; DES dry eye syndrome; MGD meibomian gland dysfunction; ATs artificial tears; PFAT's preservative free artificial tears; [place] nuclear sclerotic cataract; PSC posterior subcapsular cataract; ERM epi-retinal membrane; PVD posterior vitreous detachment; RD retinal detachment; DM diabetes mellitus; DR diabetic retinopathy; NPDR non-proliferative diabetic retinopathy; PDR proliferative diabetic retinopathy; CSME clinically significant macular edema; DME diabetic macular edema; dbh dot blot hemorrhages; CWS cotton wool spot; POAG primary open angle glaucoma; C/D cup-to-disc ratio; HVF humphrey visual field; GVF goldmann visual field; OCT optical coherence tomography; IOP intraocular pressure; BRVO Branch retinal vein occlusion; CRVO central retinal vein occlusion; CRAO central retinal artery occlusion; BRAO branch retinal artery occlusion; RT retinal tear; SB scleral buckle; PPV pars plana vitrectomy; VH Vitreous hemorrhage; PRP panretinal laser photocoagulation; IVK intravitreal kenalog; VMT vitreomacular traction; MH Macular hole;  NVD neovascularization of the disc; NVE neovascularization elsewhere; AREDS age related eye disease study; ARMD age related macular degeneration; POAG primary open angle glaucoma; EBMD epithelial/anterior basement membrane dystrophy; ACIOL anterior chamber intraocular lens; IOL intraocular lens; PCIOL posterior chamber intraocular lens; Phaco/IOL phacoemulsification with intraocular lens placement; [place] photorefractive keratectomy; LASIK laser assisted in situ keratomileusis; HTN hypertension; DM diabetes mellitus; COPD chronic obstructive pulmonary disease

## 2021-02-08 NOTE — Assessment & Plan Note (Signed)
I explained to the patient that these tend to be monocular and they do not tend to develop in the fellow eye  The peripheral location of these are no current threat to the posterior pole nor to vision.  Nonetheless typically these areas are treated with moderate to heavy and laser to ablate these areas so they do not continue to progress in a rapid fashion.  Explained the patient that new lesions may 1-Day develop in another part of the same eye and that annual examinations will be adequate to monitor for this

## 2021-02-26 IMAGING — CT CT ABD-PELV W/ CM
2 of 5 series · 15 of 46 positions shown, 17 images · IV contrast (iopamidol)
Comparison: None.

CLINICAL DATA: Periumbilical pain for 1 month.

EXAM:
CT ABDOMEN AND PELVIS WITH CONTRAST
TECHNIQUE: Multidetector CT imaging of the abdomen and pelvis was performed
using the standard protocol following bolus administration of
intravenous contrast.
CONTRAST:  100mL O1J163-1II IOPAMIDOL (O1J163-1II) INJECTION 61%

[Series 2: abd pelvis 5.00 br40 s3 axial · axial · 0.66mm/px · z∈[+1284,+1718]mm · 12 of 99 slices shown, 14 images]
[im 6/99  soft-tissue]
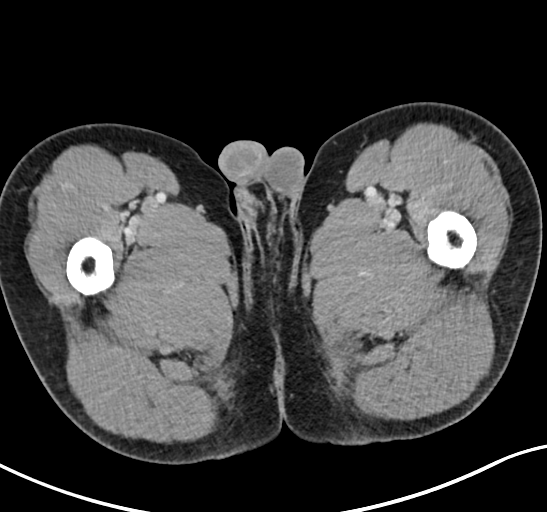
[im 6/99  bone]
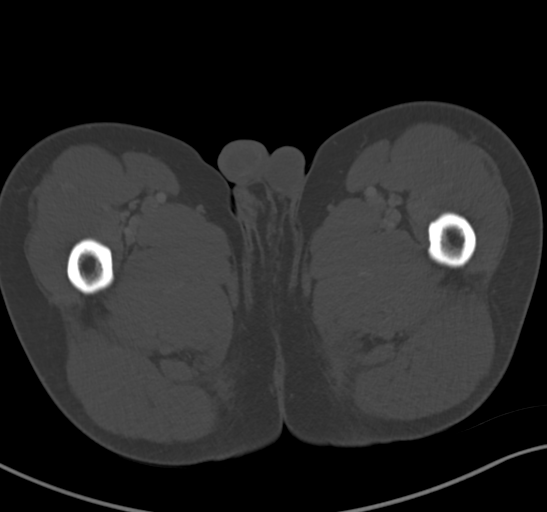
[im 17/99  soft-tissue]
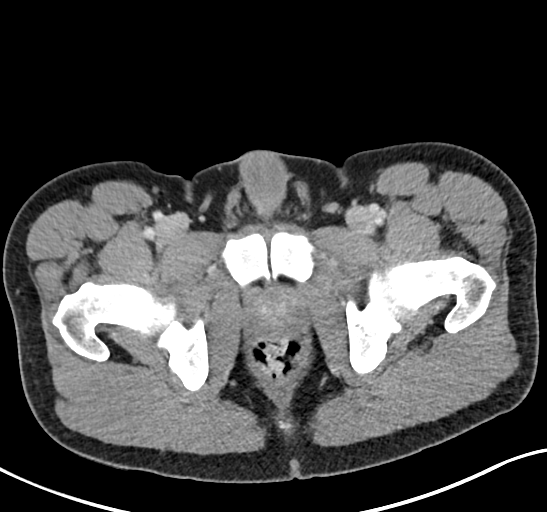
[im 22/99  soft-tissue]
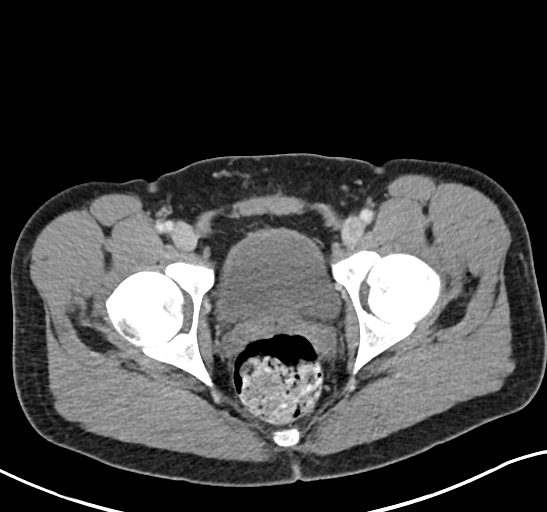
[im 28/99  soft-tissue]
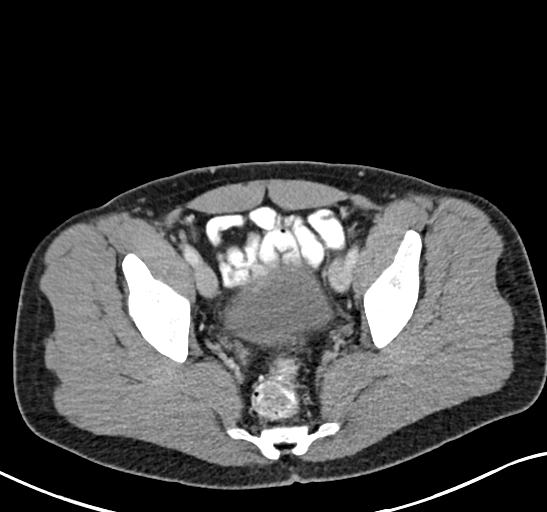
[im 39/99  soft-tissue]
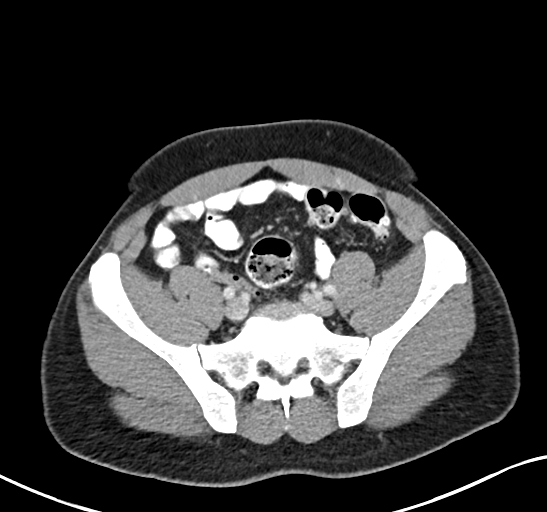
[im 44/99  soft-tissue]
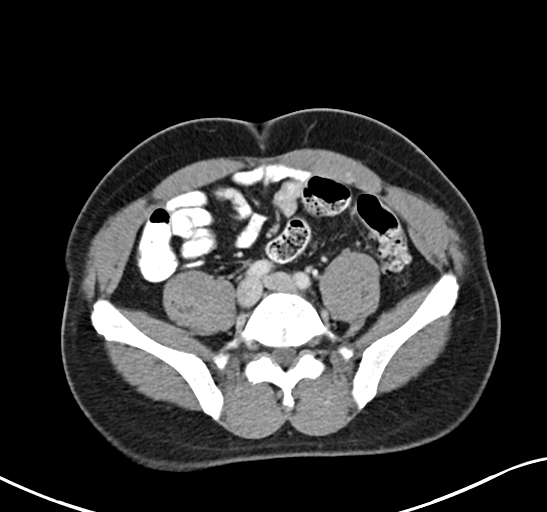
[im 55/99  soft-tissue]
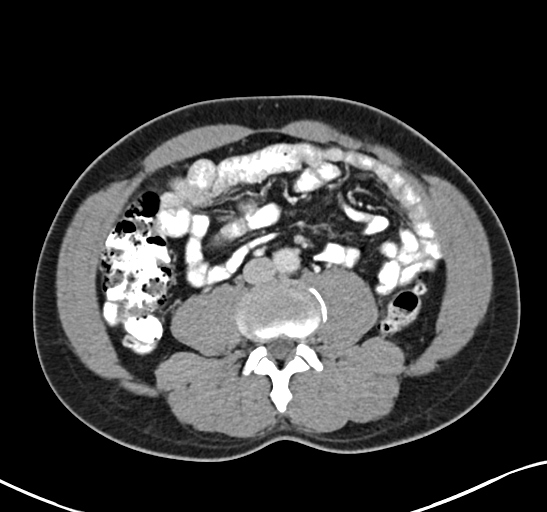
[im 60/99  soft-tissue]
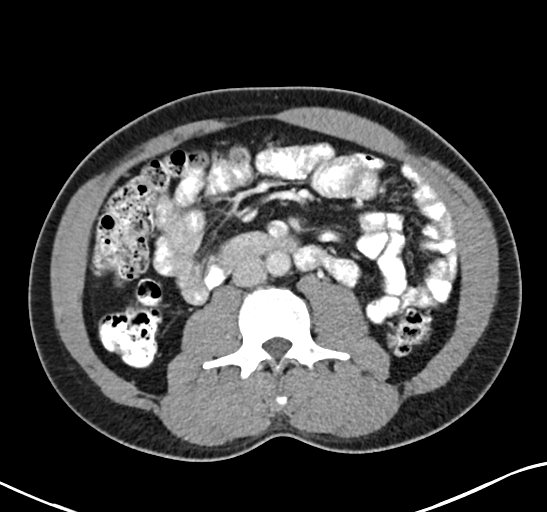
[im 71/99  soft-tissue]
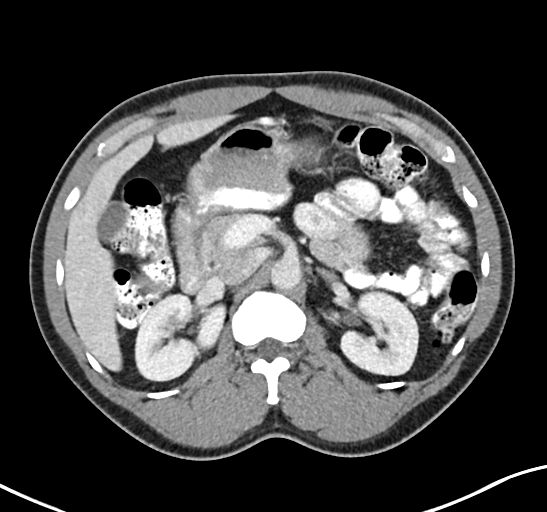
[im 71/99  bone]
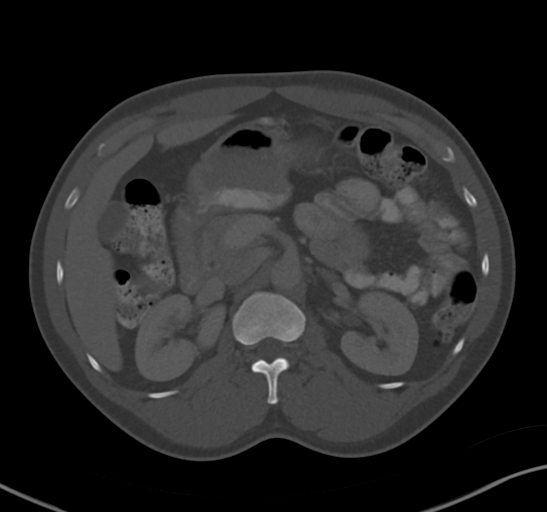
[im 77/99  soft-tissue]
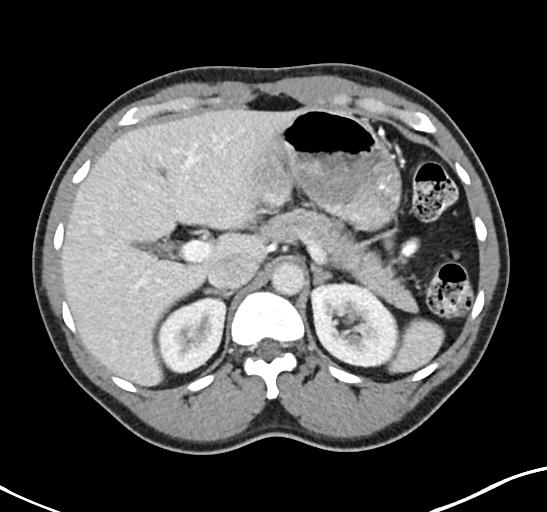
[im 82/99  soft-tissue]
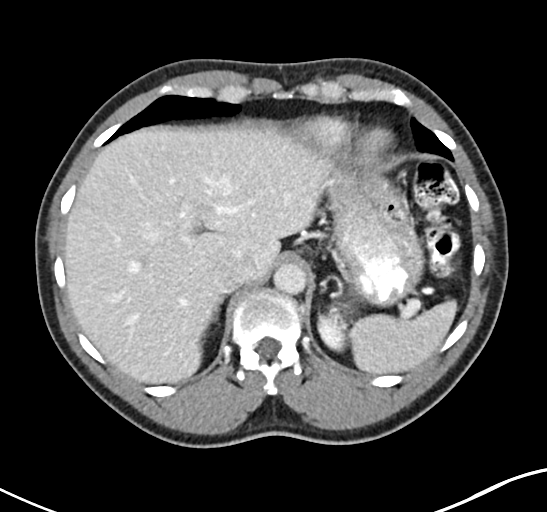
[im 93/99  soft-tissue]
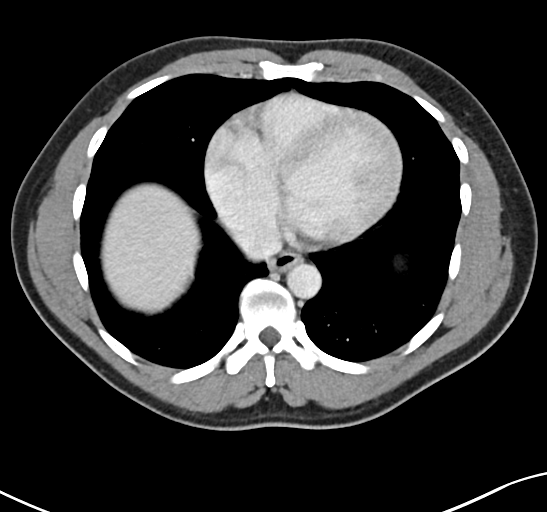

[Series 6: abd pelvis 2.00 br40 s3 cor · coronal · 0.70mm/px · 3 of 165 slices shown]
[im 55/165  soft-tissue]
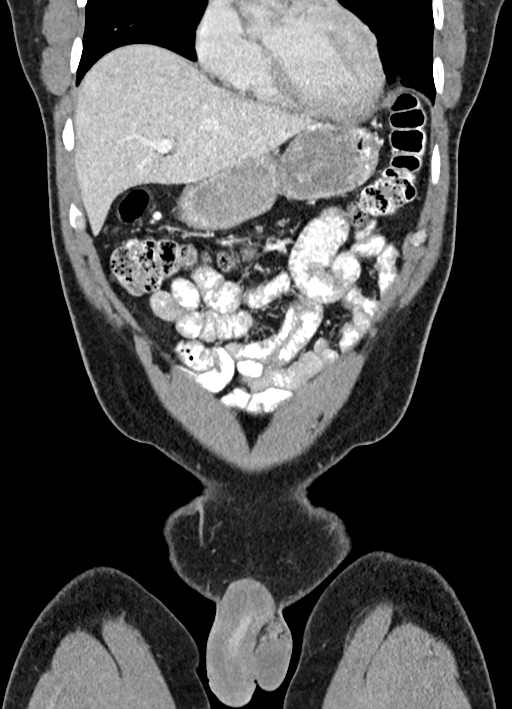
[im 73/165  soft-tissue]
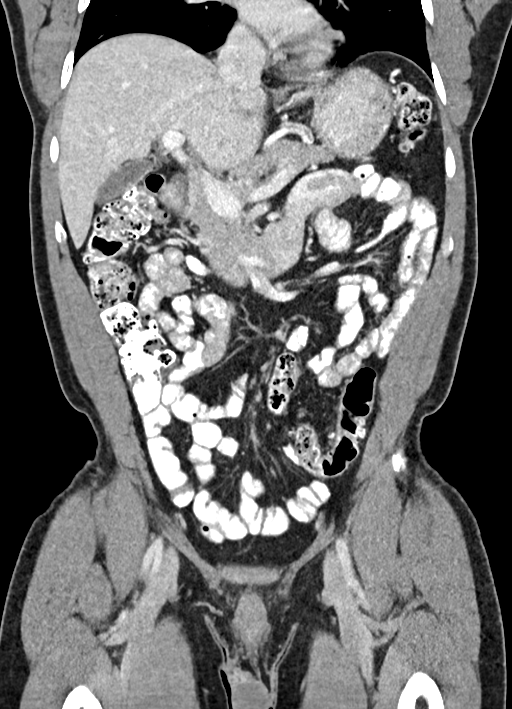
[im 92/165  soft-tissue]
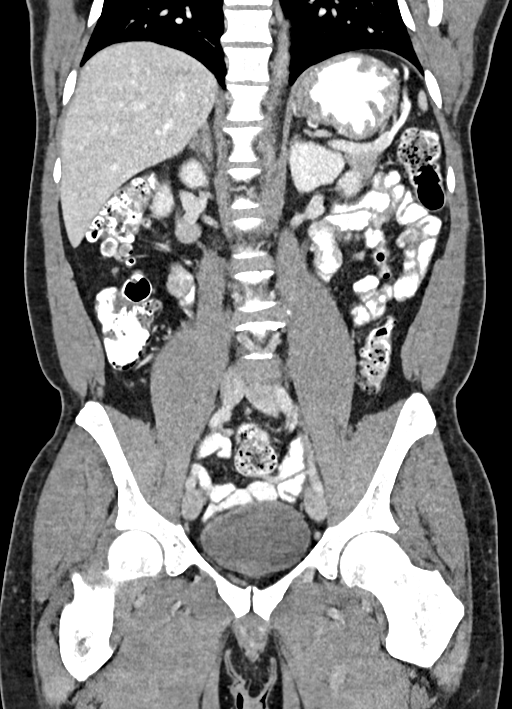

[15 of 46 positions shown; findings below may reference images not displayed]

FINDINGS: Lower chest: The visualized lung bases are clear.

Hepatobiliary: Subcentimeter hypodensity in the right hepatic lobe,
too small to fully characterize. Unremarkable gallbladder. No
biliary dilatation.

Pancreas: Unremarkable.

Spleen: Unremarkable.

Adrenals/Urinary Tract: Unremarkable adrenal glands. No evidence of
renal mass, calculi, or hydronephrosis. Unremarkable bladder.

Stomach/Bowel: The stomach is unremarkable. There is
mild-to-moderate left-sided colonic diverticulosis without evidence
of acute diverticulitis. There is a small umbilical hernia
containing a short segment of small bowel without evidence of bowel
obstruction or inflammation. The appendix is unremarkable.

Vascular/Lymphatic: Normal caliber of the abdominal aorta. No
enlarged lymph nodes.

Reproductive: Unremarkable prostate.

Other: No ascites or pneumoperitoneum.

Musculoskeletal: No acute osseous abnormality or suspicious osseous
lesion.
IMPRESSION: 1. Small umbilical hernia containing a short segment of small bowel.
No evidence of bowel obstruction or inflammation.
2. Colonic diverticulosis.

## 2021-03-02 DIAGNOSIS — I10 Essential (primary) hypertension: Secondary | ICD-10-CM | POA: Diagnosis not present

## 2021-03-08 ENCOUNTER — Encounter (INDEPENDENT_AMBULATORY_CARE_PROVIDER_SITE_OTHER): Payer: Self-pay | Admitting: Ophthalmology

## 2021-03-08 ENCOUNTER — Other Ambulatory Visit: Payer: Self-pay

## 2021-03-08 ENCOUNTER — Ambulatory Visit (INDEPENDENT_AMBULATORY_CARE_PROVIDER_SITE_OTHER): Payer: 59 | Admitting: Ophthalmology

## 2021-03-08 DIAGNOSIS — H35042 Retinal micro-aneurysms, unspecified, left eye: Secondary | ICD-10-CM

## 2021-03-08 DIAGNOSIS — H35049 Retinal micro-aneurysms, unspecified, unspecified eye: Secondary | ICD-10-CM

## 2021-03-08 MED ORDER — FLUORESCEIN SODIUM 10 % IV SOLN
500.0000 mg | INTRAVENOUS | Status: AC | PRN
  Administered 2021-03-08: 500 mg via INTRAVENOUS

## 2021-03-08 NOTE — Assessment & Plan Note (Signed)
Left eye peripheral retina nasally.  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

## 2021-03-08 NOTE — Progress Notes (Signed)
03/08/2021     CHIEF COMPLAINT Patient presents for  Chief Complaint  Patient presents with   Retina Follow Up      HISTORY OF PRESENT ILLNESS: Nathan Henson is a 45 y.o. male who presents to the clinic today for:   HPI     Retina Follow Up           Diagnosis: Other   Laterality: left eye   Onset: 4   Severity: mild   Duration: 4         Comments   4 week fp/ ffa l/r, focal laser OS with PRP lens. Patient states vision is stable and unchanged since last visit. Denies any new floaters or FOL.       Last edited by [name] on 03/08/2021 11:27 AM.      Referring physician: [name], MD [address],  [place] [address]  HISTORICAL INFORMATION:   Selected notes from the [facility]: No current outpatient medications on file. (Ophthalmic Drugs)   No current facility-administered medications for this visit. (Ophthalmic Drugs)   Current Outpatient Medications (Other)  Medication Sig   oxyCODONE (OXY IR/ROXICODONE) 5 MG immediate release tablet Take 1 tablet (5 mg total) by mouth every 6 (six) hours as needed for severe pain.   No current facility-administered medications for this visit. (Other)      REVIEW OF SYSTEMS:    ALLERGIES No Known Allergies  PAST MEDICAL HISTORY Past Medical History:  Diagnosis Date   Hypertension    no meds   Umbilical hernia    Past Surgical History:  Procedure Laterality Date   INSERTION OF MESH N/A 02/16/2020   Procedure: INSERTION OF MESH;  Surgeon: Donnie Mesa, MD;  Location: [facility];  Service: General;  Laterality: N/A;   MOUTH SURGERY     UMBILICAL HERNIA REPAIR N/A 02/16/2020   Procedure: HERNIA REPAIR UMBILICAL ADULT WITH MESH;  Surgeon: Donnie Mesa, MD;  Location: [facility];  Service: General;  Laterality: N/A;    FAMILY HISTORY History reviewed. No pertinent family  history.  SOCIAL HISTORY Social History   Tobacco Use   Smoking status: Never   Smokeless tobacco: Never  Substance Use Topics   Alcohol use: Never   Drug use: Never         OPHTHALMIC EXAM:  Base Eye Exam     Visual Acuity (ETDRS)       Right Left   Dist [place] 20/40 -1 20/50 -1   Dist ph  20/20 -1 20/20 -2         Tonometry (Tonopen, 11:30 AM)       Right Left   Pressure 14 17         Pupils       Pupils Dark Light APD   Right PERRL 5 4 None   Left PERRL 5 4 None         Extraocular Movement       Right Left    Full Full         Neuro/Psych     Oriented x3: Yes   Mood/Affect: Normal         Dilation     Both eyes: 1.0% Mydriacyl, 2.5% Phenylephrine @ 11:30 AM           Slit Lamp and Fundus Exam     External Exam  Right Left   External Normal Normal         Slit Lamp Exam       Right Left   Lids/Lashes Normal Normal   Conjunctiva/Sclera White and quiet White and quiet   Cornea Clear Clear   Anterior Chamber Deep and quiet Deep and quiet   Iris Round and reactive Round and reactive   Lens Clear Clear   Anterior Vitreous Normal Normal         Fundus Exam       Right Left   Posterior Vitreous Normal Normal   Disc Normal Normal   C/D Ratio 0.35 0.35   Macula Normal Normal   Vessels Normal, no miliary aneurysms Leber's type miliary aneurysms large aneurysmal dilations noted superiorly as well as nasally, near the equator2 prominent lesions are noted nasally centered and straddled the 9 o'clock position, and 2 more peripheral lesions are noted at the 12 o'clock position each with 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 Disc findings include normal observations. Macula : normal observations.  Vessels : normal observations. Periphery : normal observations.   Left Eye Progression has no prior data. Disc findings include normal observations. Macula : normal observations. Vessels : IRMA.   Notes Blebs labors miliary aneurysms nasal to the nerve     Fluorescein Angiography Optos (Transit OS)       Injection: 500 mg Fluorescein Sodium 10 %   Route: Intravenous   NDC: [phone]   Right Eye   Progression has no prior data. Mid/Late phase findings include normal observations. Choroidal neovascularization is not present.   Left Eye   Progression has no prior data. Early phase findings include vascular perfusion defect, microaneurysm. Mid/Late phase findings include microaneurysm, vascular perfusion defect. Choroidal neovascularization is not present.   Notes OS with leaguers miliary aneurysms in addition to dilated capillaries in the nasal quadrants.  To localize regions 1 mid peripheral superonasal and other directly nasal nerve     Focal Laser - OS - Left Eye       Anesthesia Topical anesthesia was used. Anesthetic medications included Proparacaine 0.5%.   Laser Information The type of laser was diode. Color was yellow. The duration in seconds was 0.06. The spot size was 390 microns. Laser power was 280. Total spots was 141.   Post-op The patient tolerated the procedure well. There were no complications. The patient received written and verbal post procedure care education.   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  3.  Ophthalmic Meds Ordered this visit:  Meds ordered this encounter  Medications   Fluorescein Sodium 10 % injection 500 mg       Return in about 4 months (around 07/07/2021) for dilate, OS, COLOR FP.  There are no Patient Instructions on file for this visit.   Explained the diagnoses, plan, and follow up with the patient and they expressed understanding.  Patient expressed understanding of the importance of proper follow up care.   Clent Demark Akirra Lacerda M.D. Diseases & Surgery of the Retina and Vitreous Retina & Diabetic [place] 03/08/21     Abbreviations: M myopia (nearsighted); A astigmatism; H hyperopia (farsighted); P presbyopia; Mrx spectacle prescription;  CTL contact lenses; OD right eye; OS left eye; OU both eyes  XT exotropia; ET esotropia; PEK punctate epithelial keratitis; PEE punctate epithelial erosions; DES dry eye syndrome; MGD meibomian gland dysfunction; ATs artificial tears; PFAT's preservative free artificial tears; [place] nuclear sclerotic cataract; PSC posterior subcapsular cataract; ERM epi-retinal membrane; PVD posterior vitreous detachment; RD retinal detachment; DM diabetes mellitus; DR diabetic retinopathy; NPDR non-proliferative diabetic retinopathy; PDR proliferative diabetic retinopathy; CSME clinically significant macular edema; DME diabetic macular edema; dbh dot blot hemorrhages; CWS cotton wool spot; POAG primary open angle glaucoma; C/D cup-to-disc ratio; HVF humphrey visual field; GVF goldmann visual field; OCT optical coherence tomography; IOP intraocular pressure; BRVO Branch retinal vein occlusion; CRVO central retinal vein occlusion; CRAO central retinal artery occlusion; BRAO branch retinal artery occlusion; RT retinal tear; SB scleral buckle; PPV pars plana vitrectomy;  VH Vitreous hemorrhage; PRP panretinal laser photocoagulation; IVK intravitreal kenalog; VMT vitreomacular traction; MH Macular hole;  NVD neovascularization of the disc; NVE neovascularization elsewhere; AREDS age related eye disease study; ARMD age related macular degeneration; POAG primary open angle glaucoma; EBMD epithelial/anterior basement membrane dystrophy; ACIOL anterior chamber intraocular lens; IOL intraocular lens; PCIOL posterior chamber intraocular lens; Phaco/IOL phacoemulsification with intraocular lens placement; [place] photorefractive keratectomy; LASIK laser assisted in situ keratomileusis; HTN hypertension; DM diabetes mellitus; COPD chronic obstructive pulmonary disease

## 2021-07-10 ENCOUNTER — Encounter (INDEPENDENT_AMBULATORY_CARE_PROVIDER_SITE_OTHER): Payer: Self-pay | Admitting: Ophthalmology

## 2021-07-10 ENCOUNTER — Encounter (INDEPENDENT_AMBULATORY_CARE_PROVIDER_SITE_OTHER): Payer: 59 | Admitting: Ophthalmology

## 2021-07-10 ENCOUNTER — Ambulatory Visit (INDEPENDENT_AMBULATORY_CARE_PROVIDER_SITE_OTHER): Payer: 59 | Admitting: Ophthalmology

## 2021-07-10 DIAGNOSIS — H35049 Retinal micro-aneurysms, unspecified, unspecified eye: Secondary | ICD-10-CM

## 2021-07-10 NOTE — Assessment & Plan Note (Signed)
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  We will monitor this condition on every 6 months. ? ?I explained to the patient that the current well treated area is not likely to reemerge as a problem.  Other areas in the retina peripherally typically only in the same left eye, could develop in his lifetime which deserve early treatment to prevent progression to an exudative retinopathy ?

## 2021-07-10 NOTE — Progress Notes (Signed)
? ? ?07/10/2021 ? ?  ? ?CHIEF COMPLAINT ?Patient presents for  ?Chief Complaint  ?Patient presents with  ? Retina Follow Up  ? ? ? ? ?HISTORY OF PRESENT ILLNESS: ?Nathan Henson is a 46 y.o. male who presents to the clinic today for:  ? ?HPI   ? ? Retina Follow Up   ? ?      ? 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 Onset: 4 months ago  ? Course: stable  ? ?  ?  ? ? Comments   ?4 mos fu Dilate OS, color FP. ?Pt states vision is stable, but reports his peripheral vision is bad on the right side, and he states he has not seen a doctor about it. Denies FOL or floaters. ? ?  ?  ?Last edited by [name] on 07/10/2021  3:34 PM.  ?  ? ? ?Referring physician: ?[name], MD ?[address] ?[place],  [place] [address] ? ?HISTORICAL INFORMATION:  ? ?Selected notes from the [facility] ?  ?   ? ?CURRENT MEDICATIONS: ?No current outpatient medications on file. (Ophthalmic Drugs)  ? ?No current facility-administered medications for this visit. (Ophthalmic Drugs)  ? ?Current Outpatient Medications (Other)  ?Medication Sig  ? oxyCODONE (OXY IR/ROXICODONE) 5 MG immediate release tablet Take 1 tablet (5 mg total) by mouth every 6 (six) hours as needed for severe pain.  ? ?No current facility-administered medications for this visit. (Other)  ? ? ? ? ?REVIEW OF SYSTEMS: ? ? ? ?ALLERGIES ?No Known Allergies ? ?PAST MEDICAL HISTORY ?Past Medical History:  ?Diagnosis Date  ? Hypertension   ? no meds  ? Umbilical hernia   ? ?Past Surgical History:  ?Procedure Laterality Date  ? INSERTION OF MESH N/A 02/16/2020  ? Procedure: INSERTION OF MESH;  Surgeon: Donnie Mesa, MD;  Location: [facility];  Service: General;  Laterality: N/A;  ? MOUTH SURGERY    ? UMBILICAL HERNIA REPAIR N/A 02/16/2020  ? Procedure: HERNIA REPAIR UMBILICAL ADULT WITH MESH;  Surgeon: Donnie Mesa, MD;  Location: [facility];  Service: General;  Laterality: N/A;  ? ? ?FAMILY HISTORY ?History  reviewed. No pertinent family history. ? ?SOCIAL HISTORY ?Social History  ? ?Tobacco Use  ? Smoking status: Never  ? Smokeless tobacco: Never  ?Substance Use Topics  ? Alcohol use: Never  ? Drug use: Never  ? ?  ? ?  ? ?OPHTHALMIC EXAM: ? ?Base Eye Exam   ? ? Visual Acuity (ETDRS)   ? ?   Right Left  ? Dist [place] 20/30 20/60 -1  ? Dist ph [place]  20/25  ? ?  ?  ? ? Tonometry (Tonopen, 3:38 PM)   ? ?   Right Left  ? Pressure 8 15  ? ?  ?  ? ? Pupils   ? ?   Pupils APD  ? Right PERRL None  ? Left PERRL None  ? ?  ?  ? ? Extraocular Movement   ? ?   Right Left  ?  Full Full  ? ?  ?  ? ? Neuro/Psych   ? ? Oriented x3: Yes  ? Mood/Affect: Normal  ? ?  ?  ? ? Dilation   ? ? Left eye: 1.0% Mydriacyl, 2.5% Phenylephrine @ 3:38 PM  ? ?  ?  ? ?  ? ?Slit Lamp and Fundus Exam   ? ? External Exam   ? ?  Right Left  ? External Normal Normal  ? ?  ?  ? ? Slit Lamp Exam   ? ?   Right Left  ? Lids/Lashes Normal Normal  ? Conjunctiva/Sclera White and quiet White and quiet  ? Cornea Clear Clear  ? Anterior Chamber Deep and quiet Deep and quiet  ? Iris Round and reactive Round and reactive  ? Lens 1+ Nuclear sclerosis 1+ Nuclear sclerosis  ? Anterior Vitreous Normal Normal  ? ?  ?  ? ? Fundus Exam   ? ?   Right Left  ? Posterior Vitreous  Normal  ? Disc  Normal  ? C/D Ratio  0.35  ? Macula  Normal  ? 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 Periphery  Good focal laser SN, less active MA's, Good focal laser treatment of previous cluster of miliary aneurysms, fibrotic aneurysm centrally now  ? ?  ?  ? ?  ? ? ?IMAGING AND PROCEDURES  ?Imaging and Procedures for 07/10/21 ? ?Color Fundus Photography Optos - OU - Both Eyes   ? ?   ?Right  Eye ?Progression has no prior data. Disc findings include normal observations. Macula : normal observations. Vessels : normal observations. Periphery : normal observations.  ? ?Left Eye ?Progression has no prior data. Disc findings include normal observations. Macula : normal observations. Vessels : IRMA.  ? ?Notes ?Blebs of Leber's miliary aneurysms nasal to the nerve now appear involutional in fact fibrotic with local focal treatment in this region ? ?  ? ? ?  ?  ? ?  ?ASSESSMENT/PLAN: ? 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  We will monitor this condition on every 6 months. ? ?I explained to the patient that the current well treated area is not likely to reemerge as a problem.  Other areas in the retina peripherally typically only in the same left eye, could develop in his lifetime which deserve early treatment to prevent progression to an exudative retinopathy  ? ?  ICD-10-CM   ?1. Miliary aneurysms of retina  H35.049 Color Fundus Photography Optos - OU - Both Eyes  ?  CANCELED: OCT, Retina - OU - Both Eyes  ?  ? ? ?1.  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  We will continue to monitor for new lesions typically occurring ipsilateral, however will dilate OU next ? ?2. ? ?3. ? ?Ophthalmic Meds Ordered this visit:  ?No orders of the defined types were placed in this encounter. ? ? ?  ? ?Return in about 6 months (around 01/09/2022) for DILATE OU, COLOR FP, OCT. ? ?There are no Patient Instructions on file for this visit. ? ? ?Explained the diagnoses, plan, and follow up with the patient and they expressed understanding.  Patient expressed understanding of the importance of proper follow up care.  ? ?Clent Demark. Derico Mitton M.D. ?Diseases & Surgery of the Retina and Vitreous ?[facility] ?07/10/21 ? ? ? ? ?Abbreviations: ?M myopia (nearsighted); A  astigmatism; H hyperopia (farsighted); P presbyopia; Mrx spectacle prescription;  CTL contact lenses; OD right eye; OS left eye;  OU both eyes  XT exotropia; ET esotropia; PEK punctate epithelial keratitis; PEE punctate epithelial erosions; DES dry eye syndrome; MGD meibomian gland dysfunction; ATs artificial tears; PFAT's preservative free artificial tears; [place] nuclear sclerotic cataract; PSC posterior subcapsular cataract; ERM epi-retinal membrane; PVD posterior vitreous detachment; RD retinal detachment; DM diabetes mellitus; DR diabetic retinopathy; NPDR non-proliferative diabetic retinopathy; PDR proliferative diabetic retinopathy; CSME clinically significant macular edema; DME diabetic macular edema; dbh dot blot hemorrhages; CWS cotton wool spot; POAG primary open angle glaucoma; C/D cup-to-disc ratio; HVF humphrey visual field; GVF goldmann visual field; OCT optical coherence tomography; IOP intraocular pressure; BRVO Branch retinal vein occlusion; CRVO central retinal vein occlusion; CRAO central retinal artery occlusion; BRAO branch retinal artery occlusion; RT retinal tear; SB scleral buckle; PPV pars plana vitrectomy; VH Vitreous hemorrhage; PRP panretinal laser photocoagulation; IVK intravitreal kenalog; VMT vitreomacular traction; MH Macular hole;  NVD neovascularization of the disc; NVE neovascularization elsewhere; AREDS age related eye disease study; ARMD age related macular degeneration; POAG primary open angle glaucoma; EBMD epithelial/anterior basement membrane dystrophy; ACIOL anterior chamber intraocular lens; IOL intraocular lens; PCIOL posterior chamber intraocular lens; Phaco/IOL phacoemulsification with intraocular lens placement; [place] photorefractive keratectomy; LASIK laser assisted in situ keratomileusis; HTN hypertension; DM diabetes mellitus; COPD chronic obstructive pulmonary disease ?

## 2021-08-08 DIAGNOSIS — I10 Essential (primary) hypertension: Secondary | ICD-10-CM | POA: Diagnosis not present

## 2021-09-14 DIAGNOSIS — I10 Essential (primary) hypertension: Secondary | ICD-10-CM | POA: Diagnosis not present

## 2021-09-15 ENCOUNTER — Other Ambulatory Visit (HOSPITAL_COMMUNITY): Payer: Self-pay

## 2021-09-15 MED ORDER — AMLODIPINE BESYLATE 10 MG PO TABS
10.0000 mg | ORAL_TABLET | Freq: Every day | ORAL | 1 refills | Status: AC
  Filled 2021-09-15: qty 90, 90d supply, fill #0

## 2021-09-15 MED ORDER — LOSARTAN POTASSIUM-HCTZ 100-25 MG PO TABS
1.0000 | ORAL_TABLET | Freq: Every day | ORAL | 3 refills | Status: DC
  Filled 2021-09-15: qty 90, 90d supply, fill #0

## 2021-11-02 ENCOUNTER — Inpatient Hospital Stay (HOSPITAL_COMMUNITY)
Admission: EM | Admit: 2021-11-02 | Discharge: 2021-11-07 | DRG: 378 | Disposition: A | Discharge status: Home / Self Care | Payer: 59 | Attending: Family Medicine | Admitting: Family Medicine

## 2021-11-02 ENCOUNTER — Encounter (HOSPITAL_COMMUNITY): Payer: Self-pay

## 2021-11-02 ENCOUNTER — Other Ambulatory Visit: Payer: Self-pay

## 2021-11-02 DIAGNOSIS — N179 Acute kidney failure, unspecified: Secondary | ICD-10-CM | POA: Diagnosis not present

## 2021-11-02 DIAGNOSIS — D62 Acute posthemorrhagic anemia: Secondary | ICD-10-CM | POA: Diagnosis present

## 2021-11-02 DIAGNOSIS — Z79899 Other long term (current) drug therapy: Secondary | ICD-10-CM

## 2021-11-02 DIAGNOSIS — D500 Iron deficiency anemia secondary to blood loss (chronic): Secondary | ICD-10-CM

## 2021-11-02 DIAGNOSIS — R195 Other fecal abnormalities: Secondary | ICD-10-CM | POA: Diagnosis present

## 2021-11-02 DIAGNOSIS — K921 Melena: Principal | ICD-10-CM

## 2021-11-02 DIAGNOSIS — K5730 Diverticulosis of large intestine without perforation or abscess without bleeding: Secondary | ICD-10-CM | POA: Diagnosis not present

## 2021-11-02 DIAGNOSIS — K264 Chronic or unspecified duodenal ulcer with hemorrhage: Principal | ICD-10-CM | POA: Diagnosis present

## 2021-11-02 DIAGNOSIS — K260 Acute duodenal ulcer with hemorrhage: Secondary | ICD-10-CM | POA: Diagnosis not present

## 2021-11-02 DIAGNOSIS — K922 Gastrointestinal hemorrhage, unspecified: Secondary | ICD-10-CM | POA: Diagnosis present

## 2021-11-02 DIAGNOSIS — R42 Dizziness and giddiness: Secondary | ICD-10-CM

## 2021-11-02 DIAGNOSIS — I10 Essential (primary) hypertension: Secondary | ICD-10-CM | POA: Diagnosis not present

## 2021-11-02 DIAGNOSIS — K5731 Diverticulosis of large intestine without perforation or abscess with bleeding: Secondary | ICD-10-CM | POA: Diagnosis not present

## 2021-11-02 LAB — BASIC METABOLIC PANEL
Anion gap: 6 (ref 5–15)
BUN: 51 mg/dL — ABNORMAL HIGH (ref 6–20)
CO2: 26 mmol/L (ref 22–32)
Calcium: 9.1 mg/dL (ref 8.9–10.3)
Chloride: 109 mmol/L (ref 98–111)
Creatinine, Ser: 1.49 mg/dL — ABNORMAL HIGH (ref 0.61–1.24)
GFR, Estimated: 58 mL/min — ABNORMAL LOW (ref >60)
Glucose, Bld: 119 mg/dL — ABNORMAL HIGH (ref 70–99)
Potassium: 3.6 mmol/L (ref 3.5–5.1)
Sodium: 141 mmol/L (ref 135–145)

## 2021-11-02 LAB — URINALYSIS, ROUTINE W REFLEX MICROSCOPIC
Bilirubin Urine: NEGATIVE
Glucose, UA: NEGATIVE mg/dL
Hgb urine dipstick: NEGATIVE
Ketones, ur: NEGATIVE mg/dL
Leukocytes,Ua: NEGATIVE
Nitrite: NEGATIVE
Protein, ur: NEGATIVE mg/dL
Specific Gravity, Urine: 1.02 (ref 1.005–1.030)
pH: 5 (ref 5.0–8.0)

## 2021-11-02 LAB — CBC
HCT: 22 % — ABNORMAL LOW (ref 39.0–52.0)
Hemoglobin: 7.5 g/dL — ABNORMAL LOW (ref 13.0–17.0)
MCH: 32.1 pg (ref 26.0–34.0)
MCHC: 34.1 g/dL (ref 30.0–36.0)
MCV: 94 fL (ref 80.0–100.0)
Platelets: 224 10*3/uL (ref 150–400)
RBC: 2.34 MIL/uL — ABNORMAL LOW (ref 4.22–5.81)
RDW: 13.6 % (ref 11.5–15.5)
WBC: 13.5 10*3/uL — ABNORMAL HIGH (ref 4.0–10.5)
nRBC: 0 % (ref 0.0–0.2)

## 2021-11-02 NOTE — ED Triage Notes (Signed)
Patient said he began feeling dizzy out of nowhere at 4pm. It feels like the room is spinning. He said he cannot move, but patient was ambulatory in triage. He said everywhere is weak.

## 2021-11-03 ENCOUNTER — Inpatient Hospital Stay (HOSPITAL_COMMUNITY): Payer: 59

## 2021-11-03 DIAGNOSIS — N179 Acute kidney failure, unspecified: Secondary | ICD-10-CM | POA: Diagnosis not present

## 2021-11-03 DIAGNOSIS — K922 Gastrointestinal hemorrhage, unspecified: Secondary | ICD-10-CM | POA: Diagnosis not present

## 2021-11-03 DIAGNOSIS — D62 Acute posthemorrhagic anemia: Secondary | ICD-10-CM | POA: Diagnosis present

## 2021-11-03 DIAGNOSIS — Z79899 Other long term (current) drug therapy: Secondary | ICD-10-CM | POA: Diagnosis not present

## 2021-11-03 DIAGNOSIS — K264 Chronic or unspecified duodenal ulcer with hemorrhage: Secondary | ICD-10-CM | POA: Diagnosis not present

## 2021-11-03 DIAGNOSIS — I10 Essential (primary) hypertension: Secondary | ICD-10-CM

## 2021-11-03 DIAGNOSIS — R195 Other fecal abnormalities: Secondary | ICD-10-CM | POA: Diagnosis present

## 2021-11-03 DIAGNOSIS — D500 Iron deficiency anemia secondary to blood loss (chronic): Secondary | ICD-10-CM | POA: Diagnosis not present

## 2021-11-03 DIAGNOSIS — K5731 Diverticulosis of large intestine without perforation or abscess with bleeding: Secondary | ICD-10-CM | POA: Diagnosis not present

## 2021-11-03 DIAGNOSIS — K921 Melena: Secondary | ICD-10-CM | POA: Diagnosis present

## 2021-11-03 LAB — CBC
HCT: 22.5 % — ABNORMAL LOW (ref 39.0–52.0)
Hemoglobin: 7.6 g/dL — ABNORMAL LOW (ref 13.0–17.0)
MCH: 31.9 pg (ref 26.0–34.0)
MCHC: 33.8 g/dL (ref 30.0–36.0)
MCV: 94.5 fL (ref 80.0–100.0)
Platelets: 153 10*3/uL (ref 150–400)
RBC: 2.38 MIL/uL — ABNORMAL LOW (ref 4.22–5.81)
RDW: 14.7 % (ref 11.5–15.5)
WBC: 9 10*3/uL (ref 4.0–10.5)
nRBC: 0 % (ref 0.0–0.2)

## 2021-11-03 LAB — COMPREHENSIVE METABOLIC PANEL
ALT: 13 U/L (ref 0–44)
AST: 15 U/L (ref 15–41)
Albumin: 2.8 g/dL — ABNORMAL LOW (ref 3.5–5.0)
Alkaline Phosphatase: 32 U/L — ABNORMAL LOW (ref 38–126)
Anion gap: 3 — ABNORMAL LOW (ref 5–15)
BUN: 31 mg/dL — ABNORMAL HIGH (ref 6–20)
CO2: 24 mmol/L (ref 22–32)
Calcium: 8.2 mg/dL — ABNORMAL LOW (ref 8.9–10.3)
Chloride: 113 mmol/L — ABNORMAL HIGH (ref 98–111)
Creatinine, Ser: 1.25 mg/dL — ABNORMAL HIGH (ref 0.61–1.24)
GFR, Estimated: >60 mL/min (ref >60)
Glucose, Bld: 138 mg/dL — ABNORMAL HIGH (ref 70–99)
Potassium: 3.4 mmol/L — ABNORMAL LOW (ref 3.5–5.1)
Sodium: 140 mmol/L (ref 135–145)
Total Bilirubin: 0.8 mg/dL (ref 0.3–1.2)
Total Protein: 5.2 g/dL — ABNORMAL LOW (ref 6.5–8.1)

## 2021-11-03 LAB — HEMOGLOBIN AND HEMATOCRIT, BLOOD
HCT: 22.8 % — ABNORMAL LOW (ref 39.0–52.0)
HCT: 23.6 % — ABNORMAL LOW (ref 39.0–52.0)
Hemoglobin: 7.7 g/dL — ABNORMAL LOW (ref 13.0–17.0)
Hemoglobin: 8 g/dL — ABNORMAL LOW (ref 13.0–17.0)

## 2021-11-03 LAB — PROTIME-INR
INR: 1.1 (ref 0.8–1.2)
Prothrombin Time: 13.6 seconds (ref 11.4–15.2)

## 2021-11-03 LAB — HIV ANTIBODY (ROUTINE TESTING W REFLEX): HIV Screen 4th Generation wRfx: NONREACTIVE

## 2021-11-03 LAB — ABO/RH: ABO/RH(D): A NEG

## 2021-11-03 LAB — PREPARE RBC (CROSSMATCH)

## 2021-11-03 MED ORDER — SODIUM CHLORIDE 0.9 % IV BOLUS
1000.0000 mL | Freq: Once | INTRAVENOUS | Status: AC
  Administered 2021-11-03: 1000 mL via INTRAVENOUS

## 2021-11-03 MED ORDER — POTASSIUM CHLORIDE CRYS ER 20 MEQ PO TBCR
40.0000 meq | EXTENDED_RELEASE_TABLET | Freq: Every day | ORAL | Status: DC
  Administered 2021-11-03 – 2021-11-05 (×3): 40 meq via ORAL
  Filled 2021-11-03 (×3): qty 2

## 2021-11-03 MED ORDER — LACTATED RINGERS IV SOLN: INTRAVENOUS | Status: DC

## 2021-11-03 MED ORDER — PANTOPRAZOLE 80MG IVPB - SIMPLE MED
80.0000 mg | Freq: Once | INTRAVENOUS | Status: AC
  Administered 2021-11-03: 80 mg via INTRAVENOUS
  Filled 2021-11-03: qty 80

## 2021-11-03 MED ORDER — SODIUM CHLORIDE 0.9% IV SOLUTION: Freq: Once | INTRAVENOUS | Status: AC

## 2021-11-03 MED ORDER — ORAL CARE MOUTH RINSE
15.0000 mL | OROMUCOSAL | Status: DC | PRN
Start: 2021-11-03 — End: 2021-11-07

## 2021-11-03 MED ORDER — LACTATED RINGERS IV SOLN
INTRAVENOUS | Status: AC
Start: 2021-11-03 — End: 2021-11-03

## 2021-11-03 MED ORDER — PEG 3350-KCL-NA BICARB-NACL 420 G PO SOLR
4000.0000 mL | Freq: Once | ORAL | Status: AC
  Administered 2021-11-03: 4000 mL via ORAL

## 2021-11-03 MED ORDER — PANTOPRAZOLE INFUSION (NEW) - SIMPLE MED
8.0000 mg/h | INTRAVENOUS | Status: DC
  Administered 2021-11-03 – 2021-11-06 (×8): 8 mg/h via INTRAVENOUS
  Filled 2021-11-03: qty 100
  Filled 2021-11-03 (×2): qty 80
  Filled 2021-11-03 (×3): qty 100
  Filled 2021-11-03 (×2): qty 80
  Filled 2021-11-03: qty 100
  Filled 2021-11-03: qty 80
  Filled 2021-11-03: qty 100
  Filled 2021-11-03: qty 80

## 2021-11-03 NOTE — Anesthesia Preprocedure Evaluation (Addendum)
Anesthesia Evaluation  Patient identified by MRN, date of birth, ID band Patient awake    Reviewed: Allergy & Precautions, NPO status , Patient's Chart, lab work & pertinent test results  Airway Mallampati: II  TM Distance: >3 FB Neck ROM: Full    Dental no notable dental hx. (+) Teeth Intact, Dental Advisory Given   Pulmonary neg pulmonary ROS,    Pulmonary exam normal breath sounds clear to auscultation       Cardiovascular hypertension, Normal cardiovascular exam Rhythm:Regular Rate:Normal     Neuro/Psych negative neurological ROS     GI/Hepatic Neg liver ROS,   Endo/Other    Renal/GU ARFRenal diseaseLab Results      Component                Value               Date                      CREATININE               1.25 (H)            11/03/2021              K                        3.4 (L)             11/03/2021                    Musculoskeletal   Abdominal   Peds  Hematology  (+) Blood dyscrasia, anemia , Lab Results      Component                Value               Date                      WBC                      9.0                 11/03/2021                HGB                      8.0 (L)             11/03/2021                HCT                      23.6 (L)            11/03/2021                MCV                      94.5                11/03/2021                PLT                      153                 11/03/2021  Anesthesia Other Findings   Reproductive/Obstetrics                            Anesthesia Physical Anesthesia Plan  ASA: 3  Anesthesia Plan: MAC   Post-op Pain Management: Minimal or no pain anticipated   Induction:   PONV Risk Score and Plan: 1 and Propofol infusion, TIVA and Treatment may vary due to age or medical condition  Airway Management Planned: Simple Face Mask, Natural Airway and Nasal Cannula  Additional Equipment:  None  Intra-op Plan:   Post-operative Plan:   Informed Consent: I have reviewed the patients History and Physical, chart, labs and discussed the procedure including the risks, benefits and alternatives for the proposed anesthesia with the patient or authorized representative who has indicated his/her understanding and acceptance.     Dental advisory given  Plan Discussed with:   Anesthesia Plan Comments: (EGD + Colonoscopy for GI bleed )       Anesthesia Quick Evaluation

## 2021-11-03 NOTE — Consult Note (Signed)
Referring Provider: [facility] Primary Care Physician:  Hulan Fess, MD Primary Gastroenterologist:  Sadie Haber PCP  Reason for Consultation:  GI bleed, anemia   HPI: Nathan Henson is a 46 y.o. male hypertension and umbilical hernia presented to the hospital with dizziness and weakness.  Was found to have anemia with hemoglobin of 7.5.  Was complaining of dark stools.  GI is consulted for further evaluation  Patient seen and examined at bedside.  According to him, he started having black-colored stool around 3 days ago.  Complaining of central abdominal pain which is intermittent in nature and has been going on for last 2 years.  Had some nausea but denies any vomiting.  Denies significant NSAID use.  Denies bright red blood per rectum.  Previous EGD or colonoscopy.  Blood work done with primary care physician in November 2022 showed normal hemoglobin of 13.2.  Past Medical History:  Diagnosis Date   Hypertension    no meds   Umbilical hernia     Past Surgical History:  Procedure Laterality Date   INSERTION OF MESH N/A 02/16/2020   Procedure: INSERTION OF MESH;  Surgeon: Donnie Mesa, MD;  Location: [facility];  Service: General;  Laterality: N/A;   MOUTH SURGERY     UMBILICAL HERNIA REPAIR N/A 02/16/2020   Procedure: HERNIA REPAIR UMBILICAL ADULT WITH MESH;  Surgeon: Donnie Mesa, MD;  Location: [facility];  Service: General;  Laterality: N/A;    Prior to Admission medications   Medication Sig Start Date End Date Taking? Authorizing Provider  amLODipine (NORVASC) 10 MG tablet Take 1 tablet (10 mg total) by mouth daily. 08/21/21  Yes   losartan-hydrochlorothiazide (HYZAAR) 100-25 MG tablet Take 1 tablet by mouth daily. 08/08/21  Yes     Scheduled Meds: Continuous Infusions:  lactated ringers     pantoprazole 8 mg/hr (11/03/21 0422)   PRN Meds:.mouth rinse  Allergies as of 11/02/2021   (No Known Allergies)    History reviewed. No pertinent  family history.  Social History   Socioeconomic History   Marital status: Single    Spouse name: Not on file   Number of children: Not on file   Years of education: Not on file   Highest education level: Not on file  Occupational History   Not on file  Tobacco Use   Smoking status: Never   Smokeless tobacco: Never  Substance and Sexual Activity   Alcohol use: Never   Drug use: Never   Sexual activity: Not on file  Other Topics Concern   Not on file  Social History Narrative   Not on file   Social Determinants of Health   Financial Resource Strain: Not on file  Food Insecurity: Not on file  Transportation Needs: Not on file  Physical Activity: Not on file  Stress: Not on file  Social Connections: Not on file  Intimate Partner Violence: Not on file    Review of Systems: 12 point review of system is done which is negative except as mentioned in HPI.  Physical Exam: Vital signs: Vitals:   11/03/21 0817 11/03/21 0852  BP: 106/62 111/68  Pulse: 73 72  Resp: 18 20  Temp: 98.9 F (37.2 C) 98.5 F (36.9 C)  SpO2: 100% 100%   Last BM Date : 11/02/21 Physical Exam Constitutional:      General: He is not in acute distress.    Appearance: Normal appearance. He is not ill-appearing.  HENT:     Head: Normocephalic  and atraumatic.     Nose: Nose normal.  Eyes:     General: No scleral icterus.    Extraocular Movements: Extraocular movements intact.  Cardiovascular:     Rate and Rhythm: Normal rate and regular rhythm.     Heart sounds: No murmur heard. Pulmonary:     Effort: Pulmonary effort is normal. No respiratory distress.  Abdominal:     General: Bowel sounds are normal. There is no distension.     Palpations: Abdomen is soft.     Tenderness: There is no abdominal tenderness. There is no guarding.  Skin:    General: Skin is warm.     Coloration: Skin is not jaundiced.  Neurological:     Mental Status: He is alert and oriented to person, place, and time.   Psychiatric:        Mood and Affect: Mood normal.        Thought Content: Thought content normal.        Judgment: Judgment normal.      GI:  Lab Results: Recent Labs    11/02/21 2020  WBC 13.5*  HGB 7.5*  HCT 22.0*  PLT 224   BMET Recent Labs    11/02/21 2020  NA 141  K 3.6  CL 109  CO2 26  GLUCOSE 119*  BUN 51*  CREATININE 1.49*  CALCIUM 9.1   LFT No results for input(s): "PROT", "ALBUMIN", "AST", "ALT", "ALKPHOS", "BILITOT", "BILIDIR", "IBILI" in the last 72 hours. PT/INR Recent Labs    11/03/21 0132  LABPROT 13.6  INR 1.1     Studies/Results: US RENAL  Result Date: 11/03/2021 CLINICAL DATA:  Acute kidney injury EXAM: RENAL / URINARY TRACT ULTRASOUND COMPLETE COMPARISON:  None FINDINGS: Right Kidney: Renal measurements: 9.6 x 4.4 x 5.0 cm = volume: 111 mL. Normal cortical thickness and echogenicity. No mass, hydronephrosis or shadowing calcification. Left Kidney: Renal measurements: 10.3 x 4.9 x 4.5 cm = volume: 119 mL. Normal cortical thickness and echogenicity. No mass, hydronephrosis or shadowing calcification. Bladder: Appears normal for degree of bladder distention. Other: N/A IMPRESSION: Unremarkable renal ultrasound. Electronically Signed   By: Lavonia Dana M.D.   On: 11/03/2021 10:12    Impression/Plan: -GI bleed with melena.  Hemoglobin 7.5 on admission.  Hemoglobin was normal in November 2022. - Central abdominal pain.  Could be from umbilical hernia. -Blood loss anemia.  Getting blood transfusion  Recommendations ------------------------ -Continue supportive care -Continue PPI -Repeat labs in the morning -Plan for EGD and colonoscopy tomorrow.  Okay to have clear liquids diet today.  Keep n.p.o. past midnight  Risks (bleeding, infection, bowel perforation that could require surgery, sedation-related changes in cardiopulmonary systems), benefits (identification and possible treatment of source of symptoms, exclusion of certain causes of  symptoms), and alternatives (watchful waiting, radiographic imaging studies, empiric medical treatment)  were explained to patient/family in detail and patient wishes to proceed.    LOS: 0 days   Otis Brace  MD, FACP 11/03/2021, 10:56 AM  Contact #  [phone]

## 2021-11-03 NOTE — H&P (Signed)
History and Physical    Patient: Nathan Henson:[id number] DOB: [birth year] DOA: 11/02/2021 DOS: the patient was seen and examined on 11/03/2021 PCP: Hulan Fess, MD  Patient coming from: Home  Chief Complaint:  Chief Complaint  Patient presents with   Dizziness   HPI: Nathan Henson is a 46 y.o. male with medical history significant of HTN. Presenting with dizziness and dark stools. He reports that 3 days ago, he noticed that he was having dark, tarry stools. He denies any history of GI cancers. He denies NSAID or EtOH use. He denies any history of ulcer disease. His dark stools remained persistent through yesterday. It was yesterday that he noticed that he was dizzy after he bent over to pick something up. The room seemed to spin and he needed to sit down. After sitting, he called a friend for help. He didn't have any chest pain, dyspnea, palpitations or syncopal episodes. His friend recommended he come to the ED, so he did. He denies any other aggravating or alleviating factors.   Review of Systems: As mentioned in the history of present illness. All other systems reviewed and are negative. Past Medical History:  Diagnosis Date   Hypertension    no meds   Umbilical hernia    Past Surgical History:  Procedure Laterality Date   INSERTION OF MESH N/A 02/16/2020   Procedure: INSERTION OF MESH;  Surgeon: Donnie Mesa, MD;  Location: [facility];  Service: General;  Laterality: N/A;   MOUTH SURGERY     UMBILICAL HERNIA REPAIR N/A 02/16/2020   Procedure: HERNIA REPAIR UMBILICAL ADULT WITH MESH;  Surgeon: Donnie Mesa, MD;  Location: [facility];  Service: General;  Laterality: N/A;   Social History:  reports that he has never smoked. He has never used smokeless tobacco. He reports that he does not drink alcohol and does not use drugs.  No Known Allergies  FamHx History reviewed. No pertinent family history.  Prior to Admission medications    Medication Sig Start Date End Date Taking? Authorizing Provider  amLODipine (NORVASC) 10 MG tablet Take 1 tablet (10 mg total) by mouth daily. 08/21/21  Yes   losartan-hydrochlorothiazide (HYZAAR) 100-25 MG tablet Take 1 tablet by mouth daily. 08/08/21  Yes     Physical Exam: Vitals:   11/03/21 0311 11/03/21 0425 11/03/21 0532 11/03/21 0559  BP: 104/63 116/60 107/64 (!) 104/58  Pulse: 73 73 81 71  Resp: '13 16 16 16  '$ Temp:  98.6 F (37 C) 98.6 F (37 C) 98.9 F (37.2 C)  TempSrc:  Oral Oral Oral  SpO2: 100% 100% 100% 100%  Weight:      Height:       General: 46 y.o. male resting in bed in NAD Eyes: PERRL, normal sclera ENMT: Nares patent w/o discharge, orophaynx clear, dentition normal, ears w/o discharge/lesions/ulcers Neck: Supple, trachea midline Cardiovascular: RRR, +S1, S2, no m/g/r, equal pulses throughout Respiratory: CTABL, no w/r/r, normal WOB GI: BS+, NDNT, no masses noted, no organomegaly noted MSK: No e/c/c Neuro: A&O x 3, no focal deficits Psyc: Appropriate interaction and affect, calm/cooperative  Data Reviewed:  Lab Results  Component Value Date   NA 141 11/02/2021   K 3.6 11/02/2021   CO2 26 11/02/2021   GLUCOSE 119 (H) 11/02/2021   BUN 51 (H) 11/02/2021   CREATININE 1.49 (H) 11/02/2021   CALCIUM 9.1 11/02/2021   GFRNONAA 58 (L) 11/02/2021   Lab Results  Component Value Date   WBC 13.5 (  H) 11/02/2021   HGB 7.5 (L) 11/02/2021   HCT 22.0 (L) 11/02/2021   MCV 94.0 11/02/2021   PLT 224 11/02/2021   EKG: sinus, no st elevations  Assessment and Plan: GIB     - placed in obs, progressive; change to inpatient     - q6h H&H, he is symptomatic, so 2 units pRBCs have been ordered/transfused; continue transfusions as necessary     - [facility] consulted, appreciate assistance     - continue protonix gtt; NPO for now  AKI     - baseline Scr unknown     - check renal US     - watch nephrotoxins     - fluids    Hx of HTN     - his pressures are  normotensive; hold home regimen for right now  Advance Care Planning:   Code Status: FULL  Consults: [facility]  Family Communication: None at bedside  Severity of Illness: The appropriate patient status for this patient is INPATIENT. Inpatient status is judged to be reasonable and necessary in order to provide the required intensity of service to ensure the patient's safety. The patient's presenting symptoms, physical exam findings, and initial radiographic and laboratory data in the context of their chronic comorbidities is felt to place them at high risk for further clinical deterioration. Furthermore, it is not anticipated that the patient will be medically stable for discharge from the hospital within 2 midnights of admission.   * I certify that at the point of admission it is my clinical judgment that the patient will require inpatient hospital care spanning beyond 2 midnights from the point of admission due to high intensity of service, high risk for further deterioration and high frequency of surveillance required.*  Author: Jonnie Finner, DO 11/03/2021 7:13 AM  For on call review [email or web address].

## 2021-11-03 NOTE — H&P (View-Only) (Signed)
Referring Provider: [facility] Primary Care Physician:  Hulan Fess, MD Primary Gastroenterologist:  Sadie Haber PCP  Reason for Consultation:  GI bleed, anemia   HPI: Nathan Henson is a 46 y.o. male hypertension and umbilical hernia presented to the hospital with dizziness and weakness.  Was found to have anemia with hemoglobin of 7.5.  Was complaining of dark stools.  GI is consulted for further evaluation  Patient seen and examined at bedside.  According to him, he started having black-colored stool around 3 days ago.  Complaining of central abdominal pain which is intermittent in nature and has been going on for last 2 years.  Had some nausea but denies any vomiting.  Denies significant NSAID use.  Denies bright red blood per rectum.  Previous EGD or colonoscopy.  Blood work done with primary care physician in November 2022 showed normal hemoglobin of 13.2.  Past Medical History:  Diagnosis Date   Hypertension    no meds   Umbilical hernia     Past Surgical History:  Procedure Laterality Date   INSERTION OF MESH N/A 02/16/2020   Procedure: INSERTION OF MESH;  Surgeon: Donnie Mesa, MD;  Location: [place];  Service: General;  Laterality: N/A;   MOUTH SURGERY     UMBILICAL HERNIA REPAIR N/A 02/16/2020   Procedure: HERNIA REPAIR UMBILICAL ADULT WITH MESH;  Surgeon: Donnie Mesa, MD;  Location: [place];  Service: General;  Laterality: N/A;    Prior to Admission medications   Medication Sig Start Date End Date Taking? Authorizing Provider  amLODipine (NORVASC) 10 MG tablet Take 1 tablet (10 mg total) by mouth daily. 08/21/21  Yes   losartan-hydrochlorothiazide (HYZAAR) 100-25 MG tablet Take 1 tablet by mouth daily. 08/08/21  Yes     Scheduled Meds: Continuous Infusions:  lactated ringers     pantoprazole 8 mg/hr (11/03/21 0422)   PRN Meds:.mouth rinse  Allergies as of 11/02/2021   (No Known Allergies)    History reviewed. No pertinent  family history.  Social History   Socioeconomic History   Marital status: Single    Spouse name: Not on file   Number of children: Not on file   Years of education: Not on file   Highest education level: Not on file  Occupational History   Not on file  Tobacco Use   Smoking status: Never   Smokeless tobacco: Never  Substance and Sexual Activity   Alcohol use: Never   Drug use: Never   Sexual activity: Not on file  Other Topics Concern   Not on file  Social History Narrative   Not on file   Social Determinants of Health   Financial Resource Strain: Not on file  Food Insecurity: Not on file  Transportation Needs: Not on file  Physical Activity: Not on file  Stress: Not on file  Social Connections: Not on file  Intimate Partner Violence: Not on file    Review of Systems: 12 point review of system is done which is negative except as mentioned in HPI.  Physical Exam: Vital signs: Vitals:   11/03/21 0817 11/03/21 0852  BP: 106/62 111/68  Pulse: 73 72  Resp: 18 20  Temp: 98.9 F (37.2 C) 98.5 F (36.9 C)  SpO2: 100% 100%   Last BM Date : 11/02/21 Physical Exam Constitutional:      General: He is not in acute distress.    Appearance: Normal appearance. He is not ill-appearing.  HENT:     Head: Normocephalic  and atraumatic.     Nose: Nose normal.  Eyes:     General: No scleral icterus.    Extraocular Movements: Extraocular movements intact.  Cardiovascular:     Rate and Rhythm: Normal rate and regular rhythm.     Heart sounds: No murmur heard. Pulmonary:     Effort: Pulmonary effort is normal. No respiratory distress.  Abdominal:     General: Bowel sounds are normal. There is no distension.     Palpations: Abdomen is soft.     Tenderness: There is no abdominal tenderness. There is no guarding.  Skin:    General: Skin is warm.     Coloration: Skin is not jaundiced.  Neurological:     Mental Status: He is alert and oriented to person, place, and time.   Psychiatric:        Mood and Affect: Mood normal.        Thought Content: Thought content normal.        Judgment: Judgment normal.      GI:  Lab Results: Recent Labs    11/02/21 2020  WBC 13.5*  HGB 7.5*  HCT 22.0*  PLT 224   BMET Recent Labs    11/02/21 2020  NA 141  K 3.6  CL 109  CO2 26  GLUCOSE 119*  BUN 51*  CREATININE 1.49*  CALCIUM 9.1   LFT No results for input(s): "PROT", "ALBUMIN", "AST", "ALT", "ALKPHOS", "BILITOT", "BILIDIR", "IBILI" in the last 72 hours. PT/INR Recent Labs    11/03/21 0132  LABPROT 13.6  INR 1.1     Studies/Results: US RENAL  Result Date: 11/03/2021 CLINICAL DATA:  Acute kidney injury EXAM: RENAL / URINARY TRACT ULTRASOUND COMPLETE COMPARISON:  None FINDINGS: Right Kidney: Renal measurements: 9.6 x 4.4 x 5.0 cm = volume: 111 mL. Normal cortical thickness and echogenicity. No mass, hydronephrosis or shadowing calcification. Left Kidney: Renal measurements: 10.3 x 4.9 x 4.5 cm = volume: 119 mL. Normal cortical thickness and echogenicity. No mass, hydronephrosis or shadowing calcification. Bladder: Appears normal for degree of bladder distention. Other: N/A IMPRESSION: Unremarkable renal ultrasound. Electronically Signed   By: Lavonia Dana M.D.   On: 11/03/2021 10:12    Impression/Plan: -GI bleed with melena.  Hemoglobin 7.5 on admission.  Hemoglobin was normal in November 2022. - Central abdominal pain.  Could be from umbilical hernia. -Blood loss anemia.  Getting blood transfusion  Recommendations ------------------------ -Continue supportive care -Continue PPI -Repeat labs in the morning -Plan for EGD and colonoscopy tomorrow.  Okay to have clear liquids diet today.  Keep n.p.o. past midnight  Risks (bleeding, infection, bowel perforation that could require surgery, sedation-related changes in cardiopulmonary systems), benefits (identification and possible treatment of source of symptoms, exclusion of certain causes of  symptoms), and alternatives (watchful waiting, radiographic imaging studies, empiric medical treatment)  were explained to patient/family in detail and patient wishes to proceed.    LOS: 0 days   Otis Brace  MD, FACP 11/03/2021, 10:56 AM  Contact #  [phone]

## 2021-11-03 NOTE — Progress Notes (Signed)
  Transition of Care [facility]) Screening Note   Patient Details  Name: Nathan Henson Date of Birth: 09/15/1975   Transition of Care [facility]) CM/SW Contact:    Dessa Phi, RN Phone Number: 11/03/2021, 10:20 AM    Transition of Care Department [facility]) has reviewed patient and no TOC needs have been identified at this time. We will continue to monitor patient advancement through interdisciplinary progression rounds. If new patient transition needs arise, please place a TOC consult.

## 2021-11-03 NOTE — ED Provider Notes (Signed)
[facility] Emergency Department Provider Note MRN:  [id number]  Arrival date & time: 11/03/21     Chief Complaint   Dizziness   History of Present Illness   Nathan Henson is a 46 y.o. year-old male with a history of hypertension presenting to the ED with chief complaint of dizziness.  Dizziness episode that occurred several hours ago, was bending over and then stood up and then was extremely dizzy and felt generally unwell.  Has been having black stools for the past 3 days.  Mild abdominal pain intermittently for the past few weeks.  Review of Systems  A thorough review of systems was obtained and all systems are negative except as noted in the HPI and PMH.   Patient's Health History    Past Medical History:  Diagnosis Date   Hypertension    no meds   Umbilical hernia     Past Surgical History:  Procedure Laterality Date   INSERTION OF MESH N/A 02/16/2020   Procedure: INSERTION OF MESH;  Surgeon: Donnie Mesa, MD;  Location: [place];  Service: General;  Laterality: N/A;   MOUTH SURGERY     UMBILICAL HERNIA REPAIR N/A 02/16/2020   Procedure: HERNIA REPAIR UMBILICAL ADULT WITH MESH;  Surgeon: Donnie Mesa, MD;  Location: [place];  Service: General;  Laterality: N/A;    History reviewed. No pertinent family history.  Social History   Socioeconomic History   Marital status: Single    Spouse name: Not on file   Number of children: Not on file   Years of education: Not on file   Highest education level: Not on file  Occupational History   Not on file  Tobacco Use   Smoking status: Never   Smokeless tobacco: Never  Substance and Sexual Activity   Alcohol use: Never   Drug use: Never   Sexual activity: Not on file  Other Topics Concern   Not on file  Social History Narrative   Not on file   Social Determinants of Health   Financial Resource Strain: Not on file  Food Insecurity: Not on file   Transportation Needs: Not on file  Physical Activity: Not on file  Stress: Not on file  Social Connections: Not on file  Intimate Partner Violence: Not on file     Physical Exam   Vitals:   11/03/21 0203 11/03/21 0211  BP: 118/67   Pulse: 90   Resp: 17   Temp:  99.4 F (37.4 C)  SpO2: 99%     CONSTITUTIONAL: Well-appearing, NAD NEURO/PSYCH:  Alert and oriented x 3, normal and symmetric strength and sensation, normal coordination, normal speech EYES:  eyes equal and reactive ENT/NECK:  no LAD, no JVD CARDIO: Regular rate, well-perfused, normal S1 and S2 PULM:  CTAB no wheezing or rhonchi GI/GU:  non-distended, non-tender MSK/SPINE:  No gross deformities, no edema SKIN:  no rash, atraumatic   *Additional and/or pertinent findings included in MDM below  Diagnostic and Interventional Summary    EKG Interpretation  Date/Time:  Thursday November 02 2021 20:15:52 EDT Ventricular Rate:  93 PR Interval:  148 QRS Duration: 84 QT Interval:  308 QTC Calculation: 382 R Axis:   55 Text Interpretation: Sinus rhythm with frequent Premature ventricular complexes Possible Left atrial enlargement Minimal voltage criteria for LVH, may be normal variant ( Sokolow-Lyon ) Nonspecific T wave abnormality Abnormal ECG No previous ECGs available Confirmed by Gerlene Fee [phone]) on 11/03/2021 2:39:58 AM  Labs Reviewed  BASIC METABOLIC PANEL - Abnormal; Notable for the following components:      Result Value   Glucose, Bld 119 (*)    BUN 51 (*)    Creatinine, Ser 1.49 (*)    GFR, Estimated 58 (*)    All other components within normal limits  CBC - Abnormal; Notable for the following components:   WBC 13.5 (*)    RBC 2.34 (*)    Hemoglobin 7.5 (*)    HCT 22.0 (*)    All other components within normal limits  URINALYSIS, ROUTINE W REFLEX MICROSCOPIC  PROTIME-INR  TYPE AND SCREEN  ABO/RH    No orders to display    Medications  sodium chloride 0.9 % bolus 1,000 mL (1,000 mLs  Intravenous Bolus 11/03/21 0210)     Procedures  /  Critical Care .Critical Care  Performed by: Maudie Flakes, MD Authorized by: Maudie Flakes, MD   Critical care provider statement:    Critical care time (minutes):  35   Critical care was necessary to treat or prevent imminent or life-threatening deterioration of the following conditions: GI bleeding, acute blood loss anemia with consideration of blood transfusion.   Critical care was time spent personally by me on the following activities:  Development of treatment plan with patient or surrogate, discussions with consultants, evaluation of patient's response to treatment, examination of patient, ordering and review of laboratory studies, ordering and review of radiographic studies, ordering and performing treatments and interventions, pulse oximetry, re-evaluation of patient's condition and review of old charts   ED Course and Medical Decision Making  Initial Impression and Ddx Suspicion for upper GI bleeding with acute blood loss anemia.  Dizziness is described as both room spinning and lightheadedness.  Doubt central vertigo given the reassuring neurological exam.  Awaiting labs.  Past medical/surgical history that increases complexity of ED encounter: None  Interpretation of Diagnostics I personally reviewed the laboratory assessment and my interpretation is as follows: Hemoglobin is 7.5 down from 13 back in 2022.  BUN is elevated as well suggestive of upper GI bleeding.  Mild AKI.  EKG also interpreted by me, sinus rhythm, no significant change from prior.  Patient's bedside rectal exam revealing frank melena which is Hemoccult positive.  Patient Reassessment and Ultimate Disposition/Management     Admitted to medicine for further care, providing fluids, obtaining type and screen, currently doing well hemodynamically, with any change would start blood transfusion.  Dr. Benson Norway of gastroenterology has been messaged for morning  consultation.  Patient management required discussion with the following services or consulting groups:  Hospitalist Service and Gastroenterology  Complexity of Problems Addressed Acute illness or injury that poses threat of life of bodily function  Additional Data Reviewed and Analyzed Further history obtained from: Further history from spouse/family member  Additional Factors Impacting ED Encounter Risk Consideration of hospitalization  Barth Kirks. Sedonia Small, MD [place] [email or web address]  Final Clinical Impressions(s) / ED Diagnoses     ICD-10-CM   1. Melena  K92.1     2. Dizziness  R42     3. Blood loss anemia  D50.0       ED Discharge Orders     None        Discharge Instructions Discussed with and Provided to Patient:   Discharge Instructions   None      Maudie Flakes, MD 11/03/21 [phone]

## 2021-11-04 ENCOUNTER — Inpatient Hospital Stay (HOSPITAL_COMMUNITY): Payer: 59 | Admitting: Anesthesiology

## 2021-11-04 ENCOUNTER — Encounter (HOSPITAL_COMMUNITY)
Admission: EM | Disposition: A | Discharge status: Home / Self Care | Payer: Self-pay | Source: Home / Self Care | Attending: Family Medicine

## 2021-11-04 DIAGNOSIS — K5731 Diverticulosis of large intestine without perforation or abscess with bleeding: Secondary | ICD-10-CM

## 2021-11-04 DIAGNOSIS — D500 Iron deficiency anemia secondary to blood loss (chronic): Secondary | ICD-10-CM

## 2021-11-04 DIAGNOSIS — N179 Acute kidney failure, unspecified: Secondary | ICD-10-CM

## 2021-11-04 DIAGNOSIS — K921 Melena: Secondary | ICD-10-CM

## 2021-11-04 DIAGNOSIS — K264 Chronic or unspecified duodenal ulcer with hemorrhage: Secondary | ICD-10-CM

## 2021-11-04 DIAGNOSIS — I10 Essential (primary) hypertension: Secondary | ICD-10-CM

## 2021-11-04 HISTORY — PX: SCHLEROTHERAPY: SHX5440

## 2021-11-04 HISTORY — PX: HOT HEMOSTASIS: SHX5433

## 2021-11-04 HISTORY — PX: HEMOSTASIS CLIP PLACEMENT: SHX6857

## 2021-11-04 HISTORY — PX: COLONOSCOPY WITH PROPOFOL: SHX5780

## 2021-11-04 HISTORY — PX: ESOPHAGOGASTRODUODENOSCOPY (EGD) WITH PROPOFOL: SHX5813

## 2021-11-04 LAB — CBC
HCT: 24.6 % — ABNORMAL LOW (ref 39.0–52.0)
Hemoglobin: 8.3 g/dL — ABNORMAL LOW (ref 13.0–17.0)
MCH: 32.2 pg (ref 26.0–34.0)
MCHC: 33.7 g/dL (ref 30.0–36.0)
MCV: 95.3 fL (ref 80.0–100.0)
Platelets: 160 10*3/uL (ref 150–400)
RBC: 2.58 MIL/uL — ABNORMAL LOW (ref 4.22–5.81)
RDW: 14.9 % (ref 11.5–15.5)
WBC: 8 10*3/uL (ref 4.0–10.5)
nRBC: 0 % (ref 0.0–0.2)

## 2021-11-04 LAB — COMPREHENSIVE METABOLIC PANEL
ALT: 20 U/L (ref 0–44)
AST: 24 U/L (ref 15–41)
Albumin: 3.2 g/dL — ABNORMAL LOW (ref 3.5–5.0)
Alkaline Phosphatase: 37 U/L — ABNORMAL LOW (ref 38–126)
Anion gap: 2 — ABNORMAL LOW (ref 5–15)
BUN: 19 mg/dL (ref 6–20)
CO2: 25 mmol/L (ref 22–32)
Calcium: 8.3 mg/dL — ABNORMAL LOW (ref 8.9–10.3)
Chloride: 115 mmol/L — ABNORMAL HIGH (ref 98–111)
Creatinine, Ser: 1.11 mg/dL (ref 0.61–1.24)
GFR, Estimated: >60 mL/min (ref >60)
Glucose, Bld: 102 mg/dL — ABNORMAL HIGH (ref 70–99)
Potassium: 3.9 mmol/L (ref 3.5–5.1)
Sodium: 142 mmol/L (ref 135–145)
Total Bilirubin: 1.2 mg/dL (ref 0.3–1.2)
Total Protein: 6 g/dL — ABNORMAL LOW (ref 6.5–8.1)

## 2021-11-04 LAB — HEMOGLOBIN AND HEMATOCRIT, BLOOD
HCT: 21.6 % — ABNORMAL LOW (ref 39.0–52.0)
Hemoglobin: 7.4 g/dL — ABNORMAL LOW (ref 13.0–17.0)

## 2021-11-04 SURGERY — ESOPHAGOGASTRODUODENOSCOPY (EGD) WITH PROPOFOL
Anesthesia: Monitor Anesthesia Care

## 2021-11-04 MED ORDER — PROPOFOL 1000 MG/100ML IV EMUL
INTRAVENOUS | Status: AC
  Filled 2021-11-04: qty 100

## 2021-11-04 MED ORDER — PROPOFOL 500 MG/50ML IV EMUL
INTRAVENOUS | Status: AC
  Filled 2021-11-04: qty 50

## 2021-11-04 MED ORDER — AMLODIPINE BESYLATE 10 MG PO TABS
10.0000 mg | ORAL_TABLET | Freq: Every day | ORAL | Status: DC
  Administered 2021-11-04 – 2021-11-05 (×2): 10 mg via ORAL
  Filled 2021-11-04 (×2): qty 1

## 2021-11-04 MED ORDER — LACTATED RINGERS IV SOLN
INTRAVENOUS | Status: AC | PRN
  Administered 2021-11-04: 20 mL/h via INTRAVENOUS

## 2021-11-04 MED ORDER — EPINEPHRINE 1 MG/10ML IJ SOSY
PREFILLED_SYRINGE | INTRAMUSCULAR | Status: AC
  Filled 2021-11-04: qty 10

## 2021-11-04 MED ORDER — SODIUM CHLORIDE (PF) 0.9 % IJ SOLN
PREFILLED_SYRINGE | INTRAMUSCULAR | Status: DC | PRN
  Administered 2021-11-04: 3 mL

## 2021-11-04 MED ORDER — PROPOFOL 10 MG/ML IV BOLUS
INTRAVENOUS | Status: DC | PRN
  Administered 2021-11-04: 20 mg via INTRAVENOUS

## 2021-11-04 MED ORDER — SODIUM CHLORIDE 0.9 % IV SOLN
INTRAVENOUS | Status: DC
Start: 2021-11-04 — End: 2021-11-04

## 2021-11-04 MED ORDER — LIDOCAINE 2% (20 MG/ML) 5 ML SYRINGE
INTRAMUSCULAR | Status: DC | PRN
  Administered 2021-11-04: 100 mg via INTRAVENOUS

## 2021-11-04 MED ORDER — PROPOFOL 500 MG/50ML IV EMUL
INTRAVENOUS | Status: DC | PRN
  Administered 2021-11-04: 125 ug/kg/min via INTRAVENOUS

## 2021-11-04 SURGICAL SUPPLY — 25 items

## 2021-11-04 NOTE — Progress Notes (Addendum)
[facility]  PROGRESS NOTE  Nathan Henson QQV:[id number] DOB: Jun 17, 1975 DOA: 11/02/2021 PCP: Hulan Fess, MD   Brief HPI:   46 year old male with medical history of hypertension, presented with dizziness and dark stools.  Denies previous history of GI cancer, no history of NSAID or alcohol use.  No previous history of ulcer use.  Patient became dizzy, came to ED for further evaluation.  Found to have significant anemia with hemoglobin 7.5.  2 units PRBC were ordered.  GI consulted.  Plan for EGD today    Subjective   Patient continues to have black-colored stool.   Assessment/Plan:    GI bleed -Likely upper GI bleed -Presented with black stool and dizziness, hemoglobin 7.5 -Started on Protonix gtt -[facility] has been consulted, plan for EGD today  Acute blood loss anemia -Secondary to above -Transfuse for hemoglobin less than 8  Acute kidney injury versus CKD -Presented with creatinine of 1.49 -Unknown baseline of creatinine -Improved to 1.25 with IV fluids -Renal ultrasound was unremarkable -UA is unremarkable -We will obtain urine albumin creatinine ratio  Hypertension -Antihypertensive medications on hold due to above -Blood pressure is now starting to rise -We will restart amlodipine 10 mg daily   Medications     [MAR Hold] potassium chloride  40 mEq Oral Daily     Data Reviewed:   CBG:  No results for input(s): "GLUCAP" in the last 168 hours.  SpO2: 96 % O2 Flow Rate (L/min): 10 L/min    Vitals:   11/04/21 0411 11/04/21 1259 11/04/21 1440 11/04/21 1452  BP: 117/77 136/78 (!) 140/100 (!) 130/56  Pulse: 70 70 85 88  Resp: '18 20 18 '$ (!) 22  Temp: 98.9 F (37.2 C) 98.7 F (37.1 C)    TempSrc: Oral Temporal    SpO2: 100% 100% 100% 96%  Weight:      Height:          Data Reviewed:  Basic Metabolic Panel: Recent Labs  Lab 11/02/21 2020 11/03/21 1348 11/04/21 0037  NA 141 140 142  K 3.6 3.4* 3.9  CL 109 113* 115*  CO2 '26 24  25  '$ GLUCOSE 119* 138* 102*  BUN 51* 31* 19  CREATININE 1.49* 1.25* 1.11  CALCIUM 9.1 8.2* 8.3*    CBC: Recent Labs  Lab 11/02/21 2020 11/03/21 1348 11/03/21 1834 11/04/21 0037  WBC 13.5* 9.0  --  8.0  HGB 7.5* 7.7*  7.6* 8.0* 8.3*  HCT 22.0* 22.8*  22.5* 23.6* 24.6*  MCV 94.0 94.5  --  95.3  PLT 224 153  --  160    LFT Recent Labs  Lab 11/03/21 1348 11/04/21 0037  AST 15 24  ALT 13 20  ALKPHOS 32* 37*  BILITOT 0.8 1.2  PROT 5.2* 6.0*  ALBUMIN 2.8* 3.2*     Antibiotics: Anti-infectives (From admission, onward)    None        DVT prophylaxis: SCDs  Code Status: Full code  Family Communication: No family at bedside   CONSULTS    Objective    Physical Examination:   General-appears in no acute distress Heart-S1-S2, regular, no murmur auscultated Lungs-clear to auscultation bilaterally, no wheezing or crackles auscultated Abdomen-soft, nontender, no organomegaly Extremities-no edema in the lower extremities Neuro-alert, oriented x3, no focal deficit noted   Status is: Inpatient: GI bleed         [place]   [facility] If 7PM-7AM, please contact night-coverage at [email or web address], Office  [phone]   11/04/2021,  2:59 PM  LOS: 1 day

## 2021-11-04 NOTE — Transfer of Care (Signed)
Immediate Anesthesia Transfer of Care Note  Patient: Nathan Henson  Procedure(s) Performed: ESOPHAGOGASTRODUODENOSCOPY (EGD) WITH PROPOFOL COLONOSCOPY WITH PROPOFOL SCHLEROTHERAPY HOT HEMOSTASIS (ARGON PLASMA COAGULATION/BICAP) HEMOSTASIS CLIP PLACEMENT  Patient Location: PACU and Endoscopy Unit  Anesthesia Type:MAC  Level of Consciousness: awake, alert  and patient cooperative  Airway & Oxygen Therapy: Patient Spontanous Breathing and Patient connected to face mask oxygen  Post-op Assessment: Report given to RN and Post -op Vital signs reviewed and stable  Post vital signs: Reviewed and stable  Last Vitals:  Vitals Value Taken Time  BP 140/100 11/04/21 1440  Temp    Pulse 88 11/04/21 1449  Resp 22 11/04/21 1451  SpO2 96 % 11/04/21 1449  Vitals shown include unvalidated device data.  Last Pain:  Vitals:   11/04/21 1259  TempSrc: Temporal  PainSc: 0-No pain         Complications: No notable events documented.

## 2021-11-04 NOTE — Interval H&P Note (Signed)
History and Physical Interval Note:  11/04/2021 1:43 PM  Nathan Henson  has presented today for surgery, with the diagnosis of GI bleed.  The various methods of treatment have been discussed with the patient and family. After consideration of risks, benefits and other options for treatment, the patient has consented to  Procedure(s): ESOPHAGOGASTRODUODENOSCOPY (EGD) WITH PROPOFOL (N/A) COLONOSCOPY WITH PROPOFOL (N/A) as a surgical intervention.  The patient's history has been reviewed, patient examined, no change in status, stable for surgery.  I have reviewed the patient's chart and labs.  Questions were answered to the patient's satisfaction.     Rayn Enderson

## 2021-11-04 NOTE — Brief Op Note (Addendum)
11/02/2021 - 11/04/2021  2:45 PM  PATIENT:  Nathan Henson  46 y.o. male  PRE-OPERATIVE DIAGNOSIS:  GI bleed  POST-OPERATIVE DIAGNOSIS:  EGD: ulcer, active bleed duod bulb. 3 cc epi. gold probe, 2 clips. COLON: no bleeding  PROCEDURE:  Procedure(s): ESOPHAGOGASTRODUODENOSCOPY (EGD) WITH PROPOFOL (N/A) COLONOSCOPY WITH PROPOFOL (N/A) SCHLEROTHERAPY HOT HEMOSTASIS (ARGON PLASMA COAGULATION/BICAP) (N/A) HEMOSTASIS CLIP PLACEMENT  SURGEON:  Surgeon(s) and Role:    * Solan Vosler, MD - Primary  Findings ---------- -EGD showed duodenal bulb ulcer with large visible vessel with active bleeding.  This area was treated with 3 cc epinephrine injection, gold probe cautery and 2 clips placement.  Hemostasis achieved.  -Colonoscopy showed blood in the terminal ileum as well as blood in the entire colon.  Likely from upper GI bleeding.  Recommendations ------------------------ -Recommend to continue Protonix drip for 72 hours -Transfuse if hemoglobin less than 8 -If evidence of ongoing bleeding, recommend IR consult for embolization of GDA. -Okay to have clear liquids diet today. -Avoid NSAIDs -H. pylori serology -GI will follow  Otis Brace MD, FACP 11/04/2021, 2:47 PM  Contact #  [phone]

## 2021-11-04 NOTE — Op Note (Signed)
[facility] Patient Name: Nathan Henson Procedure Date: 11/04/2021 MRN: [id number] Attending MD: Otis Brace , MD Date of Birth: Jul 22, 1975 CSN: [id number] Age: 46 Admit Type: Inpatient Procedure:                Upper GI endoscopy Indications:              Melena Providers:                Otis Brace, MD, Grace Isaac, RN, Cherylynn Ridges, Technician, Jefm Miles CRNA Referring MD:              Medicines:                Sedation Administered by an Anesthesia Professional Complications:            No immediate complications. Estimated Blood Loss:     Estimated blood loss was minimal. Procedure:                Pre-Anesthesia Assessment:                           - Prior to the procedure, a History and Physical                            was performed, and patient medications and                            allergies were reviewed. The patient's tolerance of                            previous anesthesia was also reviewed. The risks                            and benefits of the procedure and the sedation                            options and risks were discussed with the patient.                            All questions were answered, and informed consent                            was obtained. Prior Anticoagulants: The patient has                            taken no previous anticoagulant or antiplatelet                            agents. ASA Grade Assessment: III - A patient with                            severe systemic disease. After reviewing the risks  and benefits, the patient was deemed in                            satisfactory condition to undergo the procedure.                           After obtaining informed consent, the endoscope was                            passed under direct vision. Throughout the                            procedure, the patient's blood pressure, pulse, and                             oxygen saturations were monitored continuously. The                            [id number]-[id number] ([id number]) Olympus endoscope was introduced                            through the mouth, and advanced to the second part                            of duodenum. The upper GI endoscopy was performed                            with moderate difficulty due to excessive bleeding.                            The patient tolerated the procedure well. Scope In: Scope Out: Findings:      The Z-line was regular and was found 38 cm from the incisors.      Red blood was found in the entire examined stomach.      The cardia and gastric fundus were normal on retroflexion.      One oozing cratered duodenal ulcer with a large visible vessel was found       in the duodenal bulb. The lesion was 10 mm in largest dimension. Area       was successfully injected with 3 mL of a 1:10,000 solution of       epinephrine for hemostasis. Fulguration to ablate the lesion to prevent       bleeding by monopolar probe was successful. For hemostasis, two       hemostatic clips were successfully placed. There was no bleeding at the       end of the procedure.      Red blood was found in the entire duodenum. Impression:               - Z-line regular, 38 cm from the incisors.                           - Red blood in the entire stomach.                           -  Oozing duodenal ulcer with a visible vessel.                            Injected. Treated with a monopolar probe. Clips                            were placed.                           - Blood in the entire examined duodenum.                           - No specimens collected. Moderate Sedation:      Moderate (conscious) sedation was personally administered by an       anesthesia professional. The following parameters were monitored: oxygen       saturation, heart rate, blood pressure, and response to care. Recommendation:           - Return patient to  hospital ward for ongoing care.                           - Clear liquid diet.                           - Continue present medications. Procedure Code(s):        --- Professional ---                           [phone], Esophagogastroduodenoscopy, flexible,                            transoral; with control of bleeding, any method Diagnosis Code(s):        --- Professional ---                           K92.2, Gastrointestinal hemorrhage, unspecified                           K26.4, Chronic or unspecified duodenal ulcer with                            hemorrhage                           K92.1, Melena (includes Hematochezia) CPT copyright 2019 [employer]. All rights reserved. The codes documented in this report are preliminary and upon coder review may  be revised to meet current compliance requirements. Otis Brace, MD Otis Brace, MD 11/04/2021 2:40:55 PM Number of Addenda: 0

## 2021-11-04 NOTE — Anesthesia Postprocedure Evaluation (Signed)
Anesthesia Post Note  Patient: Nathan Henson  Procedure(s) Performed: ESOPHAGOGASTRODUODENOSCOPY (EGD) WITH PROPOFOL COLONOSCOPY WITH PROPOFOL SCHLEROTHERAPY HOT HEMOSTASIS (ARGON PLASMA COAGULATION/BICAP) HEMOSTASIS CLIP PLACEMENT     Patient location during evaluation: Endoscopy Anesthesia Type: MAC Level of consciousness: awake and alert Pain management: pain level controlled Vital Signs Assessment: post-procedure vital signs reviewed and stable Respiratory status: spontaneous breathing, nonlabored ventilation, respiratory function stable and patient connected to nasal cannula oxygen Cardiovascular status: blood pressure returned to baseline and stable Postop Assessment: no apparent nausea or vomiting Anesthetic complications: no   No notable events documented.  Last Vitals:  Vitals:   11/04/21 1452 11/04/21 1502  BP: (!) 130/56 (!) 141/72  Pulse: 88   Resp: (!) 22 18  Temp:    SpO2: 96% 100%    Last Pain:  Vitals:   11/04/21 1502  TempSrc:   PainSc: 0-No pain                 Barnet Glasgow

## 2021-11-04 NOTE — Anesthesia Procedure Notes (Signed)
Procedure Name: MAC Date/Time: 11/04/2021 1:49 PM  Performed by: Eben Burow, CRNAPre-anesthesia Checklist: Patient identified, Emergency Drugs available, Suction available, Patient being monitored and Timeout performed Oxygen Delivery Method: Simple face mask Placement Confirmation: positive ETCO2

## 2021-11-04 NOTE — Op Note (Signed)
[facility] Patient Name: Nathan Henson Procedure Date: 11/04/2021 MRN: [id number] Attending MD: Otis Brace , MD Date of Birth: Mar 07, 1976 CSN: [id number] Age: 46 Admit Type: Inpatient Procedure:                Colonoscopy Indications:              Gastrointestinal bleeding Providers:                Otis Brace, MD, Grace Isaac, RN, Cherylynn Ridges, Technician, Jefm Miles CRNA Referring MD:              Medicines:                Sedation Administered by an Anesthesia Professional Complications:            No immediate complications. Estimated Blood Loss:     Estimated blood loss was minimal. Procedure:                Pre-Anesthesia Assessment:                           - Prior to the procedure, a History and Physical                            was performed, and patient medications and                            allergies were reviewed. The patient's tolerance of                            previous anesthesia was also reviewed. The risks                            and benefits of the procedure and the sedation                            options and risks were discussed with the patient.                            All questions were answered, and informed consent                            was obtained. Prior Anticoagulants: The patient has                            taken no previous anticoagulant or antiplatelet                            agents. ASA Grade Assessment: III - A patient with                            severe systemic disease. After reviewing the risks  and benefits, the patient was deemed in                            satisfactory condition to undergo the procedure.                           After obtaining informed consent, the colonoscope                            was passed under direct vision. Throughout the                            procedure, the patient's blood pressure, pulse,  and                            oxygen saturations were monitored continuously. The                            [id number]-HQ190L ([id number]) Olympus colonoscope was                            introduced through the anus and advanced to the the                            terminal ileum, with identification of the                            appendiceal orifice and IC valve. The colonoscopy                            was performed with moderate difficulty due to                            inadequate bowel prep and poor endoscopic                            visualization. The patient tolerated the procedure                            well. The quality of the bowel preparation was                            inadequate. Scope In: 2:17:30 PM Scope Out: 2:32:15 PM Scope Withdrawal Time: 0 hours 7 minutes 55 seconds  Total Procedure Duration: 0 hours 14 minutes 45 seconds  Findings:      The perianal and digital rectal examinations were normal.      The terminal ileum contained hematin (altered blood/coffee-ground-like       material).      Hematin (altered blood/coffee-ground-like material) was found in the       entire colon.      Multiple small and large-mouthed diverticula were found in the sigmoid       colon.      The retroflexed view of the distal rectum and anal verge was normal and  showed no anal or rectal abnormalities. Impression:               - Preparation of the colon was inadequate.                           - Blood in the terminal ileum.                           - Blood in the entire examined colon.                           - Diverticulosis in the sigmoid colon.                           - No specimens collected. Moderate Sedation:      Moderate (conscious) sedation was personally administered by an       anesthesia professional. The following parameters were monitored: oxygen       saturation, heart rate, blood pressure, and response to care. Recommendation:           - Return  patient to hospital ward for ongoing care.                           - Clear liquid diet.                           - Continue present medications. Procedure Code(s):        --- Professional ---                           [phone], Colonoscopy, flexible; diagnostic, including                            collection of specimen(s) by brushing or washing,                            when performed (separate procedure) Diagnosis Code(s):        --- Professional ---                           K92.2, Gastrointestinal hemorrhage, unspecified                           K57.30, Diverticulosis of large intestine without                            perforation or abscess without bleeding CPT copyright 2019 American Medical Association. All rights reserved. The codes documented in this report are preliminary and upon coder review may  be revised to meet current compliance requirements. Otis Brace, MD Otis Brace, MD 11/04/2021 2:44:51 PM Number of Addenda: 0

## 2021-11-05 DIAGNOSIS — K921 Melena: Secondary | ICD-10-CM | POA: Diagnosis not present

## 2021-11-05 DIAGNOSIS — D500 Iron deficiency anemia secondary to blood loss (chronic): Secondary | ICD-10-CM | POA: Diagnosis not present

## 2021-11-05 DIAGNOSIS — N179 Acute kidney failure, unspecified: Secondary | ICD-10-CM | POA: Diagnosis not present

## 2021-11-05 LAB — BASIC METABOLIC PANEL
Anion gap: 4 — ABNORMAL LOW (ref 5–15)
BUN: 14 mg/dL (ref 6–20)
CO2: 25 mmol/L (ref 22–32)
Calcium: 8.5 mg/dL — ABNORMAL LOW (ref 8.9–10.3)
Chloride: 111 mmol/L (ref 98–111)
Creatinine, Ser: 1.3 mg/dL — ABNORMAL HIGH (ref 0.61–1.24)
GFR, Estimated: >60 mL/min (ref >60)
Glucose, Bld: 94 mg/dL (ref 70–99)
Potassium: 3.7 mmol/L (ref 3.5–5.1)
Sodium: 140 mmol/L (ref 135–145)

## 2021-11-05 LAB — PREPARE RBC (CROSSMATCH)

## 2021-11-05 LAB — HEMOGLOBIN AND HEMATOCRIT, BLOOD
HCT: 26.9 % — ABNORMAL LOW (ref 39.0–52.0)
Hemoglobin: 9.2 g/dL — ABNORMAL LOW (ref 13.0–17.0)

## 2021-11-05 MED ORDER — SODIUM CHLORIDE 0.9% IV SOLUTION: Freq: Once | INTRAVENOUS | Status: AC

## 2021-11-05 NOTE — Progress Notes (Signed)
[facility]  PROGRESS NOTE  Nathan Henson XKG:[id number] DOB: 1975/07/14 DOA: 11/02/2021 PCP: Hulan Fess, MD   Brief HPI:   46 year old male with medical history of hypertension, presented with dizziness and dark stools.  Denies previous history of GI cancer, no history of NSAID or alcohol use.  No previous history of ulcer use.  Patient became dizzy, came to ED for further evaluation.  Found to have significant anemia with hemoglobin 7.5.  2 units PRBC were ordered.  GI consulted.  Plan for EGD today    Subjective   Patient seen and examined, had bright light blood per rectum last night.  Hemoglobin dropped 1 g to 7.4.  He was transfused 2 units PRBC.  This morning he denies any further bleeding episodes.   Assessment/Plan:    GI bleed -Patient presented with melena -EGD showed duodenal ulcer with large visible vessel.  Underwent endoscopic treatment with epinephrine injection, gold probe cauterization, 2 clips placement. -Last night patient had large amount of bright red blood per rectum -Hemoglobin dropped to 7.4, s/p 2 units PRBC -Hemoglobin 9.2 this morning -Called and discussed with [facility], Dr. Alessandra Bevels recommends getting IR involved for possible embolization of gastroduodenal artery -We will consult IR for possible embolization of GDA for ongoing GI bleed   Acute blood loss anemia -Secondary to above -S/p 2 units PRBC last night; hemoglobin improved to 9.2 -Transfuse for hemoglobin less than 8  Acute kidney injury versus CKD -Presented with creatinine of 1.49 -Unknown baseline of creatinine -Improved to 1.11, however creatinine up to 1.30 this morning  -Renal ultrasound was unremarkable -UA is unremarkable -We will obtain urine albumin creatinine ratio  Hypertension -Antihypertensive medications on hold due to above -we will hold amlodipine due to ongoing GI bleed   Medications     amLODipine  10 mg Oral Daily   potassium chloride  40 mEq  Oral Daily     Data Reviewed:   CBG:  No results for input(s): "GLUCAP" in the last 168 hours.  SpO2: 100 % O2 Flow Rate (L/min): 10 L/min    Vitals:   11/05/21 0338 11/05/21 0339 11/05/21 0421 11/05/21 0655  BP: 104/62 104/62 115/72 110/70  Pulse: 82 82 70 65  Resp: '18 18 16 14  '$ Temp: 98 F (36.7 C) 98 F (36.7 C) 98.5 F (36.9 C) 98 F (36.7 C)  TempSrc: Oral Oral  Oral  SpO2: 100% 100%  100%  Weight:      Height:          Data Reviewed:  Basic Metabolic Panel: Recent Labs  Lab 11/02/21 2020 11/03/21 1348 11/04/21 0037 11/05/21 0818  NA 141 140 142 140  K 3.6 3.4* 3.9 3.7  CL 109 113* 115* 111  CO2 '26 24 25 25  '$ GLUCOSE 119* 138* 102* 94  BUN 51* 31* 19 14  CREATININE 1.49* 1.25* 1.11 1.30*  CALCIUM 9.1 8.2* 8.3* 8.5*    CBC: Recent Labs  Lab 11/02/21 2020 11/03/21 1348 11/03/21 1834 11/04/21 0037 11/04/21 2254 11/05/21 0818  WBC 13.5* 9.0  --  8.0  --   --   HGB 7.5* 7.7*  7.6* 8.0* 8.3* 7.4* 9.2*  HCT 22.0* 22.8*  22.5* 23.6* 24.6* 21.6* 26.9*  MCV 94.0 94.5  --  95.3  --   --   PLT 224 153  --  160  --   --     LFT Recent Labs  Lab 11/03/21 1348 11/04/21 0037  AST 15 24  ALT 13 20  ALKPHOS 32* 37*  BILITOT 0.8 1.2  PROT 5.2* 6.0*  ALBUMIN 2.8* 3.2*     Antibiotics: Anti-infectives (From admission, onward)    None        DVT prophylaxis: SCDs  Code Status: Full code  Family Communication: No family at bedside   CONSULTS    Objective    Physical Examination:   General-appears in no acute distress Heart-S1-S2, regular, no murmur auscultated Lungs-clear to auscultation bilaterally, no wheezing or crackles auscultated Abdomen-soft, nontender, no organomegaly Extremities-no edema in the lower extremities Neuro-alert, oriented x3, no focal deficit noted   Status is: Inpatient: GI bleed         [place]   [facility] If 7PM-7AM, please contact night-coverage at [email or web address], Office   [phone]   11/05/2021, 1:06 PM  LOS: 2 days

## 2021-11-05 NOTE — Progress Notes (Signed)
RN notified of the large liquid dark red bowel movement.  H&H was checked and showed a 1 g drop in hemoglobin to 7.4.  I spoke with GI and we decided to transfuse 2 units.  GI recommends IR involvement if patient continues to bleed.  I have asked nurse to place CBC order posttransfusion.  We will continue to monitor.  At this time vital signs are stable.

## 2021-11-05 NOTE — Progress Notes (Signed)
[facility] Progress Note  Nathan Henson 46 y.o. 1975/10/18  CC: GI bleed   Subjective: Patient seen and examined at bedside.  Had multiple episode of bright red blood per rectum overnight.  Last bowel movement at 4 AM this morning.  Denies any abdominal pain, nausea or vomiting.  ROS : afebrile, negative for chest pain   Objective: Vital signs in last 24 hours: Vitals:   11/05/21 0421 11/05/21 0655  BP: 115/72 110/70  Pulse: 70 65  Resp: 16 14  Temp: 98.5 F (36.9 C) 98 F (36.7 C)  SpO2:  100%    Physical Exam:  General:  Alert, cooperative, no distress, appears stated age  Head:  Normocephalic, without obvious abnormality, atraumatic  Eyes:  , EOM's intact,   Lungs:   Clear to auscultation bilaterally, respirations unlabored  Heart:  Regular rate and rhythm, S1, S2 normal  Abdomen:   Soft, non-tender, bowel sounds active all four quadrants,  no masses,   Extremities: Extremities normal, atraumatic, no  edema  Pulses: 2+ and symmetric    Lab Results: Recent Labs    11/04/21 0037 11/05/21 0818  NA 142 140  K 3.9 3.7  CL 115* 111  CO2 25 25  GLUCOSE 102* 94  BUN 19 14  CREATININE 1.11 1.30*  CALCIUM 8.3* 8.5*   Recent Labs    11/03/21 1348 11/04/21 0037  AST 15 24  ALT 13 20  ALKPHOS 32* 37*  BILITOT 0.8 1.2  PROT 5.2* 6.0*  ALBUMIN 2.8* 3.2*   Recent Labs    11/03/21 1348 11/03/21 1834 11/04/21 0037 11/04/21 2254 11/05/21 0818  WBC 9.0  --  8.0  --   --   HGB 7.7*  7.6*   < > 8.3* 7.4* 9.2*  HCT 22.8*  22.5*   < > 24.6* 21.6* 26.9*  MCV 94.5  --  95.3  --   --   PLT 153  --  160  --   --    < > = values in this interval not displayed.   Recent Labs    11/03/21 0132  LABPROT 13.6  INR 1.1      Assessment/Plan: -GI bleed.  EGD yesterday showed duodenal ulcer with large visible vessel.  Endoscopic treatment with epinephrine injection, gold probe cauterization and 2 clips placement was performed.  It was large amount  of fresh blood in the stomach as well as in the duodenum at the time of EGD. -Colonoscopy yesterday showed dark-colored blood throughout the colon. -Acute blood loss anemia.  Status post blood transfusion.  Recommendations ------------------------ -Appreciate interventional radiology input. - if patient continues to have ongoing bleeding, he will need IR guided embolization of GDA. -Advance diet to full liquid -Continue Protonix drip -follow H. pylori serology -Case discussed with hospitalist.  GI will follow   Otis Brace MD, [place] 11/05/2021, 10:50 AM  Contact #  [phone]

## 2021-11-05 NOTE — Consult Note (Signed)
Chief Complaint: Patient was seen in consultation today for  possible visceral arteriogram with embolization Chief Complaint  Patient presents with   Dizziness    Referring Physician(s): Lama,G  Supervising Physician: Ruthann Cancer  Patient Status: [facility] - In-pt  History of Present Illness: Nathan Henson is a 46 y.o. male non-smoker with past medical history of hypertension, umbilical hernia is admitted to [facility] on 8/3 with dizziness and dark stools.  On 8/5 he underwent EGD and colonoscopy.  EGD showed duodenal bulb ulcer with large visible vessel with active bleeding which was treated with 3 cc epinephrine injection, gold probe cautery and 2 clips with hemostasis achieved.  Colonoscopy showed blood in the terminal ileum as well as blood in the entire colon likely from upper GI source.  Patient states latest BM was approximately 3 AM this morning with maroon-colored liquid stool.  No further stool since.  He currently denies fever, headache, chest pain, dyspnea, cough, abdominal/back pain, nausea, vomiting.  Latest labs include hemoglobin 9.2 up from 7.4 posttransfusion, creatinine 1.3 normal PT/INR.  He is afebrile, BP 110/70, normal heart rate.  Request now received from [facility] to consider possible visceral arteriogram with embolization if bleeding persists.  Past Medical History:  Diagnosis Date   Hypertension    no meds   Umbilical hernia     Past Surgical History:  Procedure Laterality Date   INSERTION OF MESH N/A 02/16/2020   Procedure: INSERTION OF MESH;  Surgeon: Donnie Mesa, MD;  Location: [place];  Service: General;  Laterality: N/A;   MOUTH SURGERY     UMBILICAL HERNIA REPAIR N/A 02/16/2020   Procedure: HERNIA REPAIR UMBILICAL ADULT WITH MESH;  Surgeon: Donnie Mesa, MD;  Location: [place];  Service: General;  Laterality: N/A;    Allergies: Patient has no known allergies.  Medications: Prior to Admission  medications   Medication Sig Start Date End Date Taking? Authorizing Provider  amLODipine (NORVASC) 10 MG tablet Take 1 tablet (10 mg total) by mouth daily. 08/21/21  Yes   losartan-hydrochlorothiazide (HYZAAR) 100-25 MG tablet Take 1 tablet by mouth daily. 08/08/21  Yes      History reviewed. No pertinent family history.  Social History   Socioeconomic History   Marital status: Single    Spouse name: Not on file   Number of children: Not on file   Years of education: Not on file   Highest education level: Not on file  Occupational History   Not on file  Tobacco Use   Smoking status: Never   Smokeless tobacco: Never  Substance and Sexual Activity   Alcohol use: Never   Drug use: Never   Sexual activity: Not on file  Other Topics Concern   Not on file  Social History Narrative   Not on file   Social Determinants of Health   Financial Resource Strain: Not on file  Food Insecurity: Not on file  Transportation Needs: Not on file  Physical Activity: Not on file  Stress: Not on file  Social Connections: Not on file     Review of Systems see above  Vital Signs: BP 110/70   Pulse 65   Temp 98 F (36.7 C) (Oral)   Resp 14   Ht '5\' 9"'$  (1.753 m)   Wt 170 lb (77.1 kg)   SpO2 100%   BMI 25.10 kg/m     Physical Exam awake, alert.  Chest clear to auscultation bilaterally.  Heart with regular rate  and rhythm.  Abdomen soft, positive bowel sounds, nontender.  No lower extremity edema  Imaging: US RENAL  Result Date: 11/03/2021 CLINICAL DATA:  Acute kidney injury EXAM: RENAL / URINARY TRACT ULTRASOUND COMPLETE COMPARISON:  None FINDINGS: Right Kidney: Renal measurements: 9.6 x 4.4 x 5.0 cm = volume: 111 mL. Normal cortical thickness and echogenicity. No mass, hydronephrosis or shadowing calcification. Left Kidney: Renal measurements: 10.3 x 4.9 x 4.5 cm = volume: 119 mL. Normal cortical thickness and echogenicity. No mass, hydronephrosis or shadowing calcification. Bladder:  Appears normal for degree of bladder distention. Other: N/A IMPRESSION: Unremarkable renal ultrasound. Electronically Signed   By: Lavonia Dana M.D.   On: 11/03/2021 10:12    Labs:  CBC: Recent Labs    11/02/21 2020 11/03/21 1348 11/03/21 1834 11/04/21 0037 11/04/21 2254 11/05/21 0818  WBC 13.5* 9.0  --  8.0  --   --   HGB 7.5* 7.7*  7.6* 8.0* 8.3* 7.4* 9.2*  HCT 22.0* 22.8*  22.5* 23.6* 24.6* 21.6* 26.9*  PLT 224 153  --  160  --   --     COAGS: Recent Labs    11/03/21 0132  INR 1.1    BMP: Recent Labs    11/02/21 2020 11/03/21 1348 11/04/21 0037 11/05/21 0818  NA 141 140 142 140  K 3.6 3.4* 3.9 3.7  CL 109 113* 115* 111  CO2 '26 24 25 25  '$ GLUCOSE 119* 138* 102* 94  BUN 51* 31* 19 14  CALCIUM 9.1 8.2* 8.3* 8.5*  CREATININE 1.49* 1.25* 1.11 1.30*  GFRNONAA 58* >60 >60 >60    LIVER FUNCTION TESTS: Recent Labs    11/03/21 1348 11/04/21 0037  BILITOT 0.8 1.2  AST 15 24  ALT 13 20  ALKPHOS 32* 37*  PROT 5.2* 6.0*  ALBUMIN 2.8* 3.2*    TUMOR MARKERS: No results for input(s): "AFPTM", "CEA", "CA199", "CHROMGRNA" in the last 8760 hours.  Assessment and Plan: 46 y.o. male non-smoker with past medical history of hypertension, umbilical hernia is admitted to [facility] on 8/3 with dizziness and dark stools.  On 8/5 he underwent EGD and colonoscopy.  EGD showed duodenal bulb ulcer with large visible vessel with active bleeding which was treated with 3 cc epinephrine injection, gold probe cautery and 2 clips with hemostasis achieved.  Colonoscopy showed blood in the terminal ileum as well as blood in the entire colon likely from upper GI source.  Patient states latest BM was approximately 3 AM this morning with maroon-colored liquid stool.  No further stool since.  He currently denies fever, headache, chest pain, dyspnea, cough, abdominal/back pain, nausea, vomiting.  Latest labs include hemoglobin 9.2 up from 7.4 posttransfusion, creatinine 1.3 normal  PT/INR.  He is afebrile, BP 110/70, normal heart rate.  Request now received from [facility] to consider possible visceral arteriogram with embolization if bleeding persists.  Case has been reviewed by Dr. Serafina Royals.  Patient currently appears hemodynamically stable.  We will continue to monitor hemoglobin and if patient continues to have significant bleeding or drop in H&H would then recommend getting CTA GI bleed protocol and proceed from there.  Visceral arteriogram with embolization procedure briefly discussed with patient.   Thank you for this interesting consult.  I greatly enjoyed meeting JERAMINE DELIS and look forward to participating in their care.  A copy of this report was sent to the requesting provider on this date.  Electronically Signed: D. Rowe Robert, PA-C 11/05/2021, 9:42 AM   I  spent a total of 25 minutes    in face to face in clinical consultation, greater than 50% of which was counseling/coordinating care for possible peripheral arteriogram with embolization

## 2021-11-05 NOTE — Progress Notes (Signed)
Report received from E. Pollet,RN. No change in assessment. Will continue plan of care. Nakyah Erdmann, Laurel Dimmer, RN

## 2021-11-06 ENCOUNTER — Encounter (HOSPITAL_COMMUNITY): Payer: Self-pay | Admitting: Gastroenterology

## 2021-11-06 DIAGNOSIS — D500 Iron deficiency anemia secondary to blood loss (chronic): Secondary | ICD-10-CM | POA: Diagnosis not present

## 2021-11-06 DIAGNOSIS — N179 Acute kidney failure, unspecified: Secondary | ICD-10-CM | POA: Diagnosis not present

## 2021-11-06 DIAGNOSIS — K921 Melena: Secondary | ICD-10-CM | POA: Diagnosis not present

## 2021-11-06 LAB — TYPE AND SCREEN
ABO/RH(D): A NEG
Antibody Screen: NEGATIVE
Unit division: 0
Unit division: 0
Unit division: 0
Unit division: 0

## 2021-11-06 LAB — BPAM RBC
Blood Product Expiration Date: 202309022359
Blood Product Expiration Date: 202309022359
Blood Product Expiration Date: 202309022359
Blood Product Expiration Date: 202309032359
ISSUE DATE / TIME: 202308040536
ISSUE DATE / TIME: 202308040828
ISSUE DATE / TIME: 202308060053
ISSUE DATE / TIME: 202308060400
Unit Type and Rh: 600
Unit Type and Rh: 600
Unit Type and Rh: 600
Unit Type and Rh: 600

## 2021-11-06 LAB — CBC
HCT: 28.4 % — ABNORMAL LOW (ref 39.0–52.0)
Hemoglobin: 9.6 g/dL — ABNORMAL LOW (ref 13.0–17.0)
MCH: 31.6 pg (ref 26.0–34.0)
MCHC: 33.8 g/dL (ref 30.0–36.0)
MCV: 93.4 fL (ref 80.0–100.0)
Platelets: 181 10*3/uL (ref 150–400)
RBC: 3.04 MIL/uL — ABNORMAL LOW (ref 4.22–5.81)
RDW: 15.3 % (ref 11.5–15.5)
WBC: 7.9 10*3/uL (ref 4.0–10.5)
nRBC: 0 % (ref 0.0–0.2)

## 2021-11-06 LAB — MICROALBUMIN / CREATININE URINE RATIO
Creatinine, Urine: 82.4 mg/dL
Microalb Creat Ratio: 8 mg/g creat (ref 0–29)
Microalb, Ur: 6.4 ug/mL — ABNORMAL HIGH

## 2021-11-06 LAB — H. PYLORI ANTIBODY, IGG: H Pylori IgG: 8.14 Index Value — ABNORMAL HIGH (ref 0.00–0.79)

## 2021-11-06 MED ORDER — PANTOPRAZOLE SODIUM 40 MG IV SOLR
40.0000 mg | Freq: Two times a day (BID) | INTRAVENOUS | Status: DC
  Administered 2021-11-06 – 2021-11-07 (×2): 40 mg via INTRAVENOUS
  Filled 2021-11-06 (×3): qty 10

## 2021-11-06 NOTE — Progress Notes (Signed)
Subjective: No abdominal pain. No further blood in stool.  Objective: Vital signs in last 24 hours: Temp:  [98.3 F (36.8 C)-98.8 F (37.1 C)] 98.3 F (36.8 C) (08/07 1233) Pulse Rate:  [62-66] 62 (08/07 1233) Resp:  [16-18] 18 (08/07 1233) BP: (129-138)/(74-80) 134/80 (08/07 1233) SpO2:  [100 %] 100 % (08/07 1233) Weight change:  Last BM Date : 11/05/21  PE: GEN:  NAD ABD:  Soft, non-tender NEURO:  A/O, no encephalopathy  Lab Results:  CMP     Component Value Date/Time   NA 140 11/05/2021 0818   K 3.7 11/05/2021 0818   CL 111 11/05/2021 0818   CO2 25 11/05/2021 0818   GLUCOSE 94 11/05/2021 0818   BUN 14 11/05/2021 0818   CREATININE 1.30 (H) 11/05/2021 0818   CALCIUM 8.5 (L) 11/05/2021 0818   PROT 6.0 (L) 11/04/2021 0037   ALBUMIN 3.2 (L) 11/04/2021 0037   AST 24 11/04/2021 0037   ALT 20 11/04/2021 0037   ALKPHOS 37 (L) 11/04/2021 0037   BILITOT 1.2 11/04/2021 0037   GFRNONAA >60 11/05/2021 0818  CBC    Component Value Date/Time   WBC 7.9 11/06/2021 0410   RBC 3.04 (L) 11/06/2021 0410   HGB 9.6 (L) 11/06/2021 0410   HCT 28.4 (L) 11/06/2021 0410   PLT 181 11/06/2021 0410   MCV 93.4 11/06/2021 0410   MCH 31.6 11/06/2021 0410   MCHC 33.8 11/06/2021 0410   RDW 15.3 11/06/2021 0410   Assessment:   Melena, resolved. Acute blood loss anemia. Duodenal ulcer with visible vessel s/p endoscopic therapy 11/05/21.  Plan:   Change PPI to IV bid. Follow CBCs; transfuse as needed. Avoid NSAIDs (he doesn't use them at home). Awaiting H. Pylori serologies. Advance to soft diet. If no further bleeding, consider discharge home possibly tomorrow. [facility] will follow.   Landry Dyke 11/06/2021, 2:35 PM   Cell [phone] If no answer or after 5 PM call [phone]

## 2021-11-06 NOTE — TOC Progression Note (Signed)
Transition of Care [facility]) - Progression Note    Patient Details  Name: Nathan Henson MRN: [id number] Date of Birth: Mar 17, 1976  [facility] CM/SW Contact  Purcell Mouton, RN Phone Number: 11/06/2021, 1:14 PM  Clinical Narrative:     Pt from home with spouse. At present time there are no HH needs.    Expected Discharge Plan: Home/Self Care Barriers to Discharge: No Barriers Identified  Expected Discharge Plan and Services Expected Discharge Plan: Home/Self Care       Living arrangements for the past 2 months: Single Family Home                                       Social Determinants of Health (SDOH) Interventions    Readmission Risk Interventions     No data to display

## 2021-11-06 NOTE — Progress Notes (Signed)
[facility]  PROGRESS NOTE  Nathan Henson NTI:[id number] DOB: [birth year] DOA: 11/02/2021 PCP: Hulan Fess, MD   Brief HPI:   46 year old male with medical history of hypertension, presented with dizziness and dark stools.  Denies previous history of GI cancer, no history of NSAID or alcohol use.  No previous history of ulcer use.  Patient became dizzy, came to ED for further evaluation.  Found to have significant anemia with hemoglobin 7.5.  2 units PRBC were ordered.  GI consulted.  Plan for EGD today    Subjective   Patient seen and examined, no episodes of bright red bleeding per rectum.Hemoglobin is stable at 9.6.   Assessment/Plan:    GI bleed -Patient presented with melena -EGD showed duodenal ulcer with large visible vessel.  Underwent endoscopic treatment with epinephrine injection, gold probe cauterization, 2 clips placement. -Last night patient had large amount of bright red blood per rectum -Hemoglobin dropped to 7.4, s/p 2 units PRBC -Hemoglobin 9.6 this morning -Called and discussed with [facility], Dr. Alessandra Bevels recommends getting IR involved for possible embolization of gastroduodenal artery -IR was consulted for possible embolization of GDA artery; did not feel that patient was having active bleeding -No embolization was planned -Bleeding seems to have stopped   Acute blood loss anemia -Secondary to above -S/p 2 units PRBC last night; hemoglobin improved to 9.6 -Transfuse for hemoglobin less than 8  Acute kidney injury versus CKD -Presented with creatinine of 1.49 -Unknown baseline of creatinine -Improved to 1.11, however creatinine up to 1.30 this morning  -Renal ultrasound was unremarkable -UA is unremarkable -Microalbumin creatinine ratio is 6.4, within normal range  Hypertension -Antihypertensive medications on hold due to above -amlodipine on hold due to ongoing GI bleed   Medications        Data Reviewed:   CBG:  No results  for input(s): "GLUCAP" in the last 168 hours.  SpO2: 100 % O2 Flow Rate (L/min): 10 L/min    Vitals:   11/05/21 1315 11/05/21 2007 11/06/21 0439 11/06/21 1233  BP: 112/70 138/80 129/74 134/80  Pulse: (!) 58 64 66 62  Resp: '19 16 16 18  '$ Temp: 98.2 F (36.8 C) 98.8 F (37.1 C) 98.5 F (36.9 C) 98.3 F (36.8 C)  TempSrc: Oral Oral Oral Oral  SpO2: 100% 100% 100% 100%  Weight:      Height:          Data Reviewed:  Basic Metabolic Panel: Recent Labs  Lab 11/02/21 2020 11/03/21 1348 11/04/21 0037 11/05/21 0818  NA 141 140 142 140  K 3.6 3.4* 3.9 3.7  CL 109 113* 115* 111  CO2 '26 24 25 25  '$ GLUCOSE 119* 138* 102* 94  BUN 51* 31* 19 14  CREATININE 1.49* 1.25* 1.11 1.30*  CALCIUM 9.1 8.2* 8.3* 8.5*    CBC: Recent Labs  Lab 11/02/21 2020 11/03/21 1348 11/03/21 1834 11/04/21 0037 11/04/21 2254 11/05/21 0818 11/06/21 0410  WBC 13.5* 9.0  --  8.0  --   --  7.9  HGB 7.5* 7.7*  7.6* 8.0* 8.3* 7.4* 9.2* 9.6*  HCT 22.0* 22.8*  22.5* 23.6* 24.6* 21.6* 26.9* 28.4*  MCV 94.0 94.5  --  95.3  --   --  93.4  PLT 224 153  --  160  --   --  181    LFT Recent Labs  Lab 11/03/21 1348 11/04/21 0037  AST 15 24  ALT 13 20  ALKPHOS 32* 37*  BILITOT 0.8 1.2  PROT 5.2* 6.0*  ALBUMIN 2.8* 3.2*     Antibiotics: Anti-infectives (From admission, onward)    None        DVT prophylaxis: SCDs  Code Status: Full code  Family Communication: No family at bedside   CONSULTS    Objective    Physical Examination:   General-appears in no acute distress Heart-S1-S2, regular, no murmur auscultated Lungs-clear to auscultation bilaterally, no wheezing or crackles auscultated Abdomen-soft, nontender, no organomegaly Extremities-no edema in the lower extremities Neuro-alert, oriented x3, no focal deficit noted  Status is: Inpatient: GI bleed         [place]   [facility] If 7PM-7AM, please contact night-coverage at [email or web address], Office   [phone]   11/06/2021, 12:37 PM  LOS: 3 days

## 2021-11-07 ENCOUNTER — Telehealth (HOSPITAL_COMMUNITY): Payer: Self-pay | Admitting: Pharmacy Technician

## 2021-11-07 ENCOUNTER — Other Ambulatory Visit (HOSPITAL_COMMUNITY): Payer: Self-pay

## 2021-11-07 DIAGNOSIS — K921 Melena: Secondary | ICD-10-CM | POA: Diagnosis not present

## 2021-11-07 DIAGNOSIS — R42 Dizziness and giddiness: Secondary | ICD-10-CM

## 2021-11-07 DIAGNOSIS — N179 Acute kidney failure, unspecified: Secondary | ICD-10-CM | POA: Diagnosis not present

## 2021-11-07 DIAGNOSIS — D500 Iron deficiency anemia secondary to blood loss (chronic): Secondary | ICD-10-CM | POA: Diagnosis not present

## 2021-11-07 DIAGNOSIS — K922 Gastrointestinal hemorrhage, unspecified: Secondary | ICD-10-CM | POA: Diagnosis not present

## 2021-11-07 LAB — CBC
HCT: 29.9 % — ABNORMAL LOW (ref 39.0–52.0)
Hemoglobin: 10.2 g/dL — ABNORMAL LOW (ref 13.0–17.0)
MCH: 31.7 pg (ref 26.0–34.0)
MCHC: 34.1 g/dL (ref 30.0–36.0)
MCV: 92.9 fL (ref 80.0–100.0)
Platelets: 218 10*3/uL (ref 150–400)
RBC: 3.22 MIL/uL — ABNORMAL LOW (ref 4.22–5.81)
RDW: 15.4 % (ref 11.5–15.5)
WBC: 6.3 10*3/uL (ref 4.0–10.5)
nRBC: 0 % (ref 0.0–0.2)

## 2021-11-07 MED ORDER — METRONIDAZOLE 250 MG PO TABS: 250.0000 mg | ORAL_TABLET | Freq: Four times a day (QID) | ORAL | 0 refills | Status: AC

## 2021-11-07 MED ORDER — BISMUTH SUBSALICYLATE 262 MG PO TABS
1.0000 | ORAL_TABLET | Freq: Four times a day (QID) | ORAL | 0 refills | Status: AC
Start: 2021-11-07 — End: 2021-11-21

## 2021-11-07 MED ORDER — TETRACYCLINE HCL 500 MG PO CAPS: 500.0000 mg | ORAL_CAPSULE | Freq: Four times a day (QID) | ORAL | 0 refills | Status: AC

## 2021-11-07 MED ORDER — PANTOPRAZOLE SODIUM 40 MG PO TBEC: 40.0000 mg | DELAYED_RELEASE_TABLET | Freq: Two times a day (BID) | ORAL | 3 refills | Status: AC

## 2021-11-07 MED ORDER — LOSARTAN POTASSIUM-HCTZ 50-12.5 MG PO TABS: 1.0000 | ORAL_TABLET | Freq: Every day | ORAL | 3 refills | Status: AC

## 2021-11-07 MED ORDER — BISMUTH SUBSALICYLATE 262 MG/15ML PO SUSP: 20.0000 mL | Freq: Three times a day (TID) | ORAL | 0 refills | Status: DC

## 2021-11-07 NOTE — Progress Notes (Signed)
Patient discharged home, discharge instructions given and explained to patient, her verbalized understanding, patient denies any pain/distress, accompanied home by family.

## 2021-11-07 NOTE — Discharge Summary (Signed)
Physician Discharge Summary   Patient: Nathan Henson MRN: [id number] DOB: [birth year]  Admit date:     11/02/2021  Discharge date: 11/07/21  Discharge Physician: Oswald Hillock   PCP: Hulan Fess, MD   Recommendations at discharge:   Follow-up gastroenterology in 4 weeks  Discharge Diagnoses: Principal Problem:   GI bleeding Active Problems:   HTN (hypertension)   AKI (acute kidney injury) ([place])  Resolved Problems:   * No resolved hospital problems. *  Hospital Course:  46 year old male with medical history of hypertension, presented with dizziness and dark stools.  Denies previous history of GI cancer, no history of NSAID or alcohol use.  No previous history of ulcer use.  Patient became dizzy, came to ED for further evaluation.  Found to have significant anemia with hemoglobin 7.5.  2 units PRBC were ordered.  GI consulted.  Plan for EGD today  Assessment and Plan:  GI bleed -Patient presented with melena -EGD showed duodenal ulcer with large visible vessel.  Underwent endoscopic treatment with epinephrine injection, gold probe cauterization, 2 clips placement. -Last night patient had large amount of bright red blood per rectum -Hemoglobin dropped to 7.4, s/p 2 units PRBC -Called and discussed with [facility], Dr. Alessandra Bevels recommends getting IR involved for possible embolization of gastroduodenal artery -IR was consulted for possible embolization of GDA artery; did not feel that patient was having active bleeding -No embolization was planned; bleeding stopped so IR did not do the procedure -No further bleeding episodes, today hemoglobin is up to 10.2 -Will continue Protonix 40 mg p.o. twice daily  H. pylori seropositive -Will discharge on bismuth subsalicylate 703 mg 1 tablet 4 times a day for 14 days, tetracycline 500 mg 4 times a day for 14 days, metronidazole to 50 mg 4 times a day for 14 days, no NSAIDs use. -Started on Protonix 40 mg p.o. twice daily as above -Will  need testing to confirm the eradication of H. pylori   Acute blood loss anemia -Secondary to above -S/p 2 units PRBC last night; hemoglobin improved to 10.2   Acute kidney injury versus CKD -Presented with creatinine of 1.49 -Unknown baseline of creatinine -Improved to 1.11, however creatinine up to 1.30 this morning  -Renal ultrasound was unremarkable -UA is unremarkable -Microalbumin creatinine ratio is 6.4, within normal range -Patient takes Hyzaar 100/25 at home, will cut down the dose to 50/12.5 daily   Hypertension -Dose of Hyzaar changed as above, continue amlodipine 10 mg daily        Consultants: Gastroenterology, IR Procedures performed: EGD Disposition: Home Diet recommendation:  Discharge Diet Orders (From admission, onward)     Start     Ordered   11/07/21 0000  Diet - low sodium heart healthy        11/07/21 1121           Regular diet DISCHARGE MEDICATION: Allergies as of 11/07/2021   No Known Allergies      Medication List     STOP taking these medications    losartan-hydrochlorothiazide 100-25 MG tablet Commonly known as: HYZAAR Replaced by: losartan-hydrochlorothiazide 50-12.5 MG tablet       TAKE these medications    amLODipine 10 MG tablet Commonly known as: NORVASC Take 1 tablet (10 mg total) by mouth daily.   Bismuth Subsalicylate 500 MG Tabs Take 1 tablet (262 mg total) by mouth in the morning, at noon, in the evening, and at bedtime for 14 days.   losartan-hydrochlorothiazide 50-12.5 MG tablet Commonly  known as: Hyzaar Take 1 tablet by mouth daily. Replaces: losartan-hydrochlorothiazide 100-25 MG tablet   metroNIDAZOLE 250 MG tablet Commonly known as: FLAGYL Take 1 tablet (250 mg total) by mouth 4 (four) times daily for 14 days.   pantoprazole 40 MG tablet Commonly known as: Protonix Take 1 tablet (40 mg total) by mouth 2 (two) times daily.   tetracycline 500 MG capsule Commonly known as: SUMYCIN Take 1 capsule (500  mg total) by mouth 4 (four) times daily for 14 days.        Follow-up Information     Brahmbhatt, Parag, MD Follow up in 4 week(s).   Specialty: Gastroenterology Contact information: [address] [place] [address] [phone]                Discharge Exam: Danley Danker Weights   11/02/21 2007  Weight: 77.1 kg   Heart S1-S2, regular Abdomen soft, nontender, no organomegaly  Condition at discharge: good  The results of significant diagnostics from this hospitalization (including imaging, microbiology, ancillary and laboratory) are listed below for reference.   Imaging Studies: US RENAL  Result Date: 11/03/2021 CLINICAL DATA:  Acute kidney injury EXAM: RENAL / URINARY TRACT ULTRASOUND COMPLETE COMPARISON:  None FINDINGS: Right Kidney: Renal measurements: 9.6 x 4.4 x 5.0 cm = volume: 111 mL. Normal cortical thickness and echogenicity. No mass, hydronephrosis or shadowing calcification. Left Kidney: Renal measurements: 10.3 x 4.9 x 4.5 cm = volume: 119 mL. Normal cortical thickness and echogenicity. No mass, hydronephrosis or shadowing calcification. Bladder: Appears normal for degree of bladder distention. Other: N/A IMPRESSION: Unremarkable renal ultrasound. Electronically Signed   By: Lavonia Dana M.D.   On: 11/03/2021 10:12    Microbiology: Results for orders placed or performed during the hospital encounter of 02/12/20  SARS CORONAVIRUS 2 (TAT 6-24 HRS) Nasopharyngeal Nasopharyngeal Swab     Status: None   Collection Time: 02/12/20  1:33 PM   Specimen: Nasopharyngeal Swab  Result Value Ref Range Status   SARS Coronavirus 2 NEGATIVE NEGATIVE Final    Comment: (NOTE) SARS-CoV-2 target nucleic acids are NOT DETECTED.  The SARS-CoV-2 RNA is generally detectable in upper and lower respiratory specimens during the acute phase of infection. Negative results do not preclude SARS-CoV-2 infection, do not rule out co-infections with other pathogens, and should not be  used as the sole basis for treatment or other patient management decisions. Negative results must be combined with clinical observations, patient history, and epidemiological information. The expected result is Negative.  Fact Sheet for Patients: SugarRoll.be  Fact Sheet for Healthcare Providers: https://www.woods-mathews.com/  This test is not yet approved or cleared by the [place] FDA and  has been authorized for detection and/or diagnosis of SARS-CoV-2 by FDA under an Emergency Use Authorization (EUA). This EUA will remain  in effect (meaning this test can be used) for the duration of the COVID-19 declaration under Se ction 564(b)(1) of the Act, 21 U.S.C. section 360bbb-3(b)(1), unless the authorization is terminated or revoked sooner.  Performed at [facility], [place] [address] [place], [place] [address]     Labs: CBC: Recent Labs  Lab 11/02/21 2020 11/03/21 1348 11/03/21 1834 11/04/21 0037 11/04/21 2254 11/05/21 0818 11/06/21 0410 11/07/21 0954  WBC 13.5* 9.0  --  8.0  --   --  7.9 6.3  HGB 7.5* 7.7*  7.6*   < > 8.3* 7.4* 9.2* 9.6* 10.2*  HCT 22.0* 22.8*  22.5*   < > 24.6* 21.6* 26.9*  28.4* 29.9*  MCV 94.0 94.5  --  95.3  --   --  93.4 92.9  PLT 224 153  --  160  --   --  181 218   < > = values in this interval not displayed.   Basic Metabolic Panel: Recent Labs  Lab 11/02/21 2020 11/03/21 1348 11/04/21 0037 11/05/21 0818  NA 141 140 142 140  K 3.6 3.4* 3.9 3.7  CL 109 113* 115* 111  CO2 '26 24 25 25  '$ GLUCOSE 119* 138* 102* 94  BUN 51* 31* 19 14  CREATININE 1.49* 1.25* 1.11 1.30*  CALCIUM 9.1 8.2* 8.3* 8.5*   Liver Function Tests: Recent Labs  Lab 11/03/21 1348 11/04/21 0037  AST 15 24  ALT 13 20  ALKPHOS 32* 37*  BILITOT 0.8 1.2  PROT 5.2* 6.0*  ALBUMIN 2.8* 3.2*   CBG: No results for input(s): "GLUCAP" in the last 168 hours.  Discharge time spent: greater than 30  minutes.  Signed: Oswald Hillock, MD [facility] 11/07/2021

## 2021-11-07 NOTE — Discharge Instructions (Signed)
Do not use NSAIDs, ibuprofen, Aleve, Motrin or Advil

## 2021-11-07 NOTE — Progress Notes (Signed)
IR was requested for image guided mesenteric angiogram with possible embolizatiog for GIB on 8/6.   Case was reviewed by Dr. Serafina Royals, the bloody BM was thought to be due to old blood as pt was hemodynamically stable and there was no clinical evidence of continued hemorrhage. The procedure was placed on hold.   Pt hgb stable x 2 days, VSS, GI signed off today.    Will delete the IR eval order.  Please call IR for questions and concerns.   Armando Gang Albaraa Swingle PA-C 11/07/2021 10:46 AM

## 2021-11-07 NOTE — TOC Benefit Eligibility Note (Signed)
Patient Teacher, English as a foreign language completed.    The patient is currently admitted and upon discharge could be taking Bismuth/Metronidaz/Tetracyclin 140-125-125 MG Caps.  The current 30 day co-pay is $738.73 due to a $775.99 deductible remaining.   The patient is insured through [employer] [place], [place] Patient Advocate Specialist [place] Patient Advocate Team Direct Number: [phone]  Fax: [phone]

## 2021-11-07 NOTE — Progress Notes (Signed)
Subjective: No abdominal pain. No GI bleeding. Had bowel movement this morning; no blood.  Objective: Vital signs in last 24 hours: Temp:  [97.9 F (36.6 C)-98.6 F (37 C)] 98.6 F (37 C) (08/08 0921) Pulse Rate:  [55-86] 86 (08/08 0921) Resp:  [16-18] 16 (08/08 0921) BP: (116-135)/(69-80) 135/73 (08/08 0921) SpO2:  [97 %-100 %] 100 % (08/08 0921) Weight change:  Last BM Date : 11/05/21  PE: GEN: NAD HEENT:  [place]/AT, anicteric NEURO:  A/O, no encephalopathy  Lab Results: Results for orders placed or performed during the hospital encounter of 11/02/21 (from the past 48 hour(s))  CBC     Status: Abnormal   Collection Time: 11/06/21  4:10 AM  Result Value Ref Range   WBC 7.9 4.0 - 10.5 K/uL   RBC 3.04 (L) 4.22 - 5.81 MIL/uL   Hemoglobin 9.6 (L) 13.0 - 17.0 g/dL   HCT 28.4 (L) 39.0 - 52.0 %   MCV 93.4 80.0 - 100.0 fL   MCH 31.6 26.0 - 34.0 pg   MCHC 33.8 30.0 - 36.0 g/dL   RDW 15.3 11.5 - 15.5 %   Platelets 181 150 - 400 K/uL   nRBC 0.0 0.0 - 0.2 %    Comment: Performed at [facility], [place] [address], [place], [place] [address]   H pylori +  Assessment:   Melena, resolved. Acute blood loss anemia. Duodenal ulcer with visible vessel s/p endoscopic therapy 11/05/21. H. Pylori seropositive.  Plan:   OK to advance diet as tolerated. Needs Pantoprazole 40 mg po bid (until further notice). Outpatient treatment H pylori:  Bismuth subsalicylate 983 mg po QID x 14 days, tetracycline 500 mg PO QID x 14 days (if no tetracycline available, ok to use doxycycline 100 mg po BID x 14 days), metronidazole 250 mg po QID x 14 days. No NSAIDs. Needs outpatient follow-up with Dr. Alessandra Bevels in 4-6 weeks; will ultimately need testing to confirm H. Pylori eradication. [facility] will sign-off; please call with questions; thank you for the consultation.   Landry Dyke 11/07/2021, 9:39 AM   Cell [phone] If no answer or after 5 PM call [phone]

## 2021-11-07 NOTE — Telephone Encounter (Signed)
Pharmacy Patient Advocate Encounter  Insurance verification completed.    The patient is insured through [employer]   The patient is currently admitted and ran test claims for the following: Bismuth/Metronidaz/Tetracyclin 140-125-125 MG Caps..  Copays and coinsurance results were relayed to Inpatient clinical team.

## 2022-01-01 DIAGNOSIS — B9681 Helicobacter pylori [H. pylori] as the cause of diseases classified elsewhere: Secondary | ICD-10-CM | POA: Diagnosis not present

## 2022-01-01 DIAGNOSIS — K264 Chronic or unspecified duodenal ulcer with hemorrhage: Secondary | ICD-10-CM | POA: Diagnosis not present

## 2022-01-01 DIAGNOSIS — D62 Acute posthemorrhagic anemia: Secondary | ICD-10-CM | POA: Diagnosis not present

## 2022-01-02 ENCOUNTER — Encounter (INDEPENDENT_AMBULATORY_CARE_PROVIDER_SITE_OTHER): Payer: 59 | Admitting: Ophthalmology

## 2022-01-09 ENCOUNTER — Encounter (INDEPENDENT_AMBULATORY_CARE_PROVIDER_SITE_OTHER): Payer: 59 | Admitting: Ophthalmology

## 2022-01-24 ENCOUNTER — Encounter (INDEPENDENT_AMBULATORY_CARE_PROVIDER_SITE_OTHER): Payer: 59 | Admitting: Ophthalmology

## 2022-01-24 DIAGNOSIS — B9681 Helicobacter pylori [H. pylori] as the cause of diseases classified elsewhere: Secondary | ICD-10-CM | POA: Diagnosis not present

## 2022-01-24 DIAGNOSIS — K264 Chronic or unspecified duodenal ulcer with hemorrhage: Secondary | ICD-10-CM | POA: Diagnosis not present

## 2022-02-06 DIAGNOSIS — D509 Iron deficiency anemia, unspecified: Secondary | ICD-10-CM | POA: Diagnosis not present

## 2022-02-06 DIAGNOSIS — D124 Benign neoplasm of descending colon: Secondary | ICD-10-CM | POA: Diagnosis not present

## 2022-02-06 DIAGNOSIS — K648 Other hemorrhoids: Secondary | ICD-10-CM | POA: Diagnosis not present

## 2022-02-06 DIAGNOSIS — K5730 Diverticulosis of large intestine without perforation or abscess without bleeding: Secondary | ICD-10-CM | POA: Diagnosis not present

## 2022-02-08 DIAGNOSIS — I10 Essential (primary) hypertension: Secondary | ICD-10-CM | POA: Diagnosis not present

## 2022-02-08 DIAGNOSIS — E782 Mixed hyperlipidemia: Secondary | ICD-10-CM | POA: Diagnosis not present

## 2022-02-08 DIAGNOSIS — Z6824 Body mass index (BMI) 24.0-24.9, adult: Secondary | ICD-10-CM | POA: Diagnosis not present

## 2022-02-08 DIAGNOSIS — Z0000 Encounter for general adult medical examination without abnormal findings: Secondary | ICD-10-CM | POA: Diagnosis not present

## 2022-08-14 DIAGNOSIS — I10 Essential (primary) hypertension: Secondary | ICD-10-CM | POA: Diagnosis not present

## 2022-08-14 DIAGNOSIS — E782 Mixed hyperlipidemia: Secondary | ICD-10-CM | POA: Diagnosis not present

## 2022-08-16 ENCOUNTER — Other Ambulatory Visit (HOSPITAL_COMMUNITY): Payer: Self-pay

## 2022-08-17 ENCOUNTER — Other Ambulatory Visit (HOSPITAL_COMMUNITY): Payer: Self-pay

## 2023-02-12 DIAGNOSIS — I10 Essential (primary) hypertension: Secondary | ICD-10-CM | POA: Diagnosis not present

## 2023-02-12 DIAGNOSIS — Z125 Encounter for screening for malignant neoplasm of prostate: Secondary | ICD-10-CM | POA: Diagnosis not present

## 2023-02-12 DIAGNOSIS — Z0000 Encounter for general adult medical examination without abnormal findings: Secondary | ICD-10-CM | POA: Diagnosis not present

## 2023-02-12 DIAGNOSIS — E782 Mixed hyperlipidemia: Secondary | ICD-10-CM | POA: Diagnosis not present

## 2023-08-16 DIAGNOSIS — E782 Mixed hyperlipidemia: Secondary | ICD-10-CM | POA: Diagnosis not present

## 2023-08-16 DIAGNOSIS — I10 Essential (primary) hypertension: Secondary | ICD-10-CM | POA: Diagnosis not present

## 2024-02-13 DIAGNOSIS — E782 Mixed hyperlipidemia: Secondary | ICD-10-CM | POA: Diagnosis not present

## 2024-02-13 DIAGNOSIS — J309 Allergic rhinitis, unspecified: Secondary | ICD-10-CM | POA: Diagnosis not present

## 2024-02-13 DIAGNOSIS — I10 Essential (primary) hypertension: Secondary | ICD-10-CM | POA: Diagnosis not present

## 2024-02-13 DIAGNOSIS — Z125 Encounter for screening for malignant neoplasm of prostate: Secondary | ICD-10-CM | POA: Diagnosis not present

## 2024-02-13 DIAGNOSIS — Z0000 Encounter for general adult medical examination without abnormal findings: Secondary | ICD-10-CM | POA: Diagnosis not present

## 2024-02-13 DIAGNOSIS — Z6825 Body mass index (BMI) 25.0-25.9, adult: Secondary | ICD-10-CM | POA: Diagnosis not present

## 2024-02-19 DIAGNOSIS — M25511 Pain in right shoulder: Secondary | ICD-10-CM | POA: Diagnosis not present
